# Patient Record
Sex: Female | Born: 1955
Health system: Southern US, Community
[De-identification: ages and names within clinical notes are randomized; demographics above are authoritative.]

## PROBLEM LIST (undated history)

## (undated) DIAGNOSIS — M949 Disorder of cartilage, unspecified: Secondary | ICD-10-CM

## (undated) DIAGNOSIS — E785 Hyperlipidemia, unspecified: Secondary | ICD-10-CM

## (undated) DIAGNOSIS — Z87898 Personal history of other specified conditions: Secondary | ICD-10-CM

## (undated) DIAGNOSIS — F329 Major depressive disorder, single episode, unspecified: Secondary | ICD-10-CM

## (undated) DIAGNOSIS — G4700 Insomnia, unspecified: Secondary | ICD-10-CM

## (undated) DIAGNOSIS — M899 Disorder of bone, unspecified: Secondary | ICD-10-CM

## (undated) DIAGNOSIS — F411 Generalized anxiety disorder: Secondary | ICD-10-CM

## (undated) DIAGNOSIS — J309 Allergic rhinitis, unspecified: Secondary | ICD-10-CM

## (undated) DIAGNOSIS — E039 Hypothyroidism, unspecified: Secondary | ICD-10-CM

## (undated) HISTORY — DX: Insomnia, unspecified: G47.00

## (undated) HISTORY — DX: Disorder of cartilage, unspecified: M94.9

## (undated) HISTORY — DX: Disorder of bone, unspecified: M89.9

## (undated) HISTORY — DX: Generalized anxiety disorder: F41.1

## (undated) HISTORY — DX: Hyperlipidemia, unspecified: E78.5

## (undated) HISTORY — DX: Major depressive disorder, single episode, unspecified: F32.9

## (undated) HISTORY — DX: Hypothyroidism, unspecified: E03.9

## (undated) HISTORY — DX: Allergic rhinitis, unspecified: J30.9

## (undated) HISTORY — DX: Personal history of other specified conditions: Z87.898

---

## 1998-07-07 ENCOUNTER — Other Ambulatory Visit: Admission: RE | Admit: 1998-07-07 | Discharge: 1998-07-07 | Payer: Self-pay | Admitting: Obstetrics and Gynecology

## 1999-10-20 ENCOUNTER — Other Ambulatory Visit: Admission: RE | Admit: 1999-10-20 | Discharge: 1999-10-20 | Payer: Self-pay | Admitting: Obstetrics and Gynecology

## 1999-12-05 ENCOUNTER — Encounter: Payer: Self-pay | Admitting: Internal Medicine

## 1999-12-05 LAB — CONVERTED CEMR LAB

## 2001-12-18 ENCOUNTER — Other Ambulatory Visit: Admission: RE | Admit: 2001-12-18 | Discharge: 2001-12-18 | Payer: Self-pay | Admitting: Obstetrics and Gynecology

## 2006-03-28 ENCOUNTER — Ambulatory Visit: Payer: Self-pay | Admitting: Internal Medicine

## 2007-04-15 ENCOUNTER — Ambulatory Visit: Payer: Self-pay | Admitting: Internal Medicine

## 2007-04-15 LAB — CONVERTED CEMR LAB
BUN: 7 mg/dL (ref 6–23)
Basophils Absolute: 0 10*3/uL (ref 0.0–0.1)
Bilirubin, Direct: 0.1 mg/dL (ref 0.0–0.3)
CO2: 30 meq/L (ref 19–32)
Calcium: 9.2 mg/dL (ref 8.4–10.5)
Cholesterol: 297 mg/dL (ref 0–200)
Direct LDL: 181.9 mg/dL
Eosinophils Absolute: 0.4 10*3/uL (ref 0.0–0.6)
GFR calc Af Amer: 85 mL/min
GFR calc non Af Amer: 70 mL/min
Glucose, Bld: 107 mg/dL — ABNORMAL HIGH (ref 70–99)
HDL: 47.4 mg/dL (ref >39.0)
Ketones, ur: NEGATIVE mg/dL
Lymphocytes Relative: 43.9 % (ref 12.0–46.0)
MCHC: 35.2 g/dL (ref 30.0–36.0)
MCV: 84.6 fL (ref 78.0–100.0)
Monocytes Relative: 8.9 % (ref 3.0–11.0)
Mucus, UA: NEGATIVE
Neutro Abs: 3.1 10*3/uL (ref 1.4–7.7)
Platelets: 337 10*3/uL (ref 150–400)
Potassium: 4.5 meq/L (ref 3.5–5.1)
RBC: 4.58 M/uL (ref 3.87–5.11)
TSH: 1.27 microintl units/mL (ref 0.35–5.50)
Total Protein: 6.7 g/dL (ref 6.0–8.3)
Triglycerides: 294 mg/dL (ref 0–149)
Urine Glucose: NEGATIVE mg/dL
Urobilinogen, UA: 0.2 (ref 0.0–1.0)
pH: 6 (ref 5.0–8.0)

## 2007-04-24 ENCOUNTER — Ambulatory Visit: Payer: Self-pay | Admitting: Internal Medicine

## 2007-10-09 ENCOUNTER — Encounter: Payer: Self-pay | Admitting: Internal Medicine

## 2007-10-09 DIAGNOSIS — J309 Allergic rhinitis, unspecified: Secondary | ICD-10-CM

## 2007-10-09 DIAGNOSIS — Z87898 Personal history of other specified conditions: Secondary | ICD-10-CM | POA: Insufficient documentation

## 2007-10-09 DIAGNOSIS — E7800 Pure hypercholesterolemia, unspecified: Secondary | ICD-10-CM | POA: Insufficient documentation

## 2007-10-09 HISTORY — DX: Personal history of other specified conditions: Z87.898

## 2007-10-09 HISTORY — DX: Allergic rhinitis, unspecified: J30.9

## 2007-10-10 ENCOUNTER — Ambulatory Visit: Payer: Self-pay | Admitting: Internal Medicine

## 2007-10-10 DIAGNOSIS — E039 Hypothyroidism, unspecified: Secondary | ICD-10-CM | POA: Insufficient documentation

## 2007-10-10 DIAGNOSIS — E785 Hyperlipidemia, unspecified: Secondary | ICD-10-CM | POA: Insufficient documentation

## 2007-10-10 DIAGNOSIS — F411 Generalized anxiety disorder: Secondary | ICD-10-CM

## 2007-10-10 HISTORY — DX: Generalized anxiety disorder: F41.1

## 2007-10-10 HISTORY — DX: Hyperlipidemia, unspecified: E78.5

## 2007-10-10 HISTORY — DX: Hypothyroidism, unspecified: E03.9

## 2008-04-20 ENCOUNTER — Ambulatory Visit: Payer: Self-pay | Admitting: Internal Medicine

## 2008-04-20 LAB — CONVERTED CEMR LAB
ALT: 21 units/L (ref 0–35)
AST: 24 units/L (ref 0–37)
Albumin: 3.9 g/dL (ref 3.5–5.2)
Alkaline Phosphatase: 71 units/L (ref 39–117)
BUN: 9 mg/dL (ref 6–23)
Basophils Absolute: 0 10*3/uL (ref 0.0–0.1)
Basophils Relative: 0.6 % (ref 0.0–1.0)
Bilirubin Urine: NEGATIVE
Bilirubin, Direct: 0.1 mg/dL (ref 0.0–0.3)
CO2: 30 meq/L (ref 19–32)
Calcium: 9.3 mg/dL (ref 8.4–10.5)
Chloride: 108 meq/L (ref 96–112)
Cholesterol: 267 mg/dL (ref 0–200)
Creatinine, Ser: 0.9 mg/dL (ref 0.4–1.2)
Direct LDL: 185.4 mg/dL
Eosinophils Absolute: 0.4 10*3/uL (ref 0.0–0.7)
Eosinophils Relative: 5.3 % — ABNORMAL HIGH (ref 0.0–5.0)
GFR calc Af Amer: 85 mL/min
GFR calc non Af Amer: 70 mL/min
Glucose, Bld: 87 mg/dL (ref 70–99)
HCT: 40.6 % (ref 36.0–46.0)
HDL: 56.6 mg/dL (ref >39.0)
Hemoglobin: 13.9 g/dL (ref 12.0–15.0)
Ketones, ur: NEGATIVE mg/dL
Leukocytes, UA: NEGATIVE
Lymphocytes Relative: 48 % — ABNORMAL HIGH (ref 12.0–46.0)
MCHC: 34.1 g/dL (ref 30.0–36.0)
MCV: 86 fL (ref 78.0–100.0)
Monocytes Absolute: 0.6 10*3/uL (ref 0.1–1.0)
Monocytes Relative: 8 % (ref 3.0–12.0)
Neutro Abs: 2.9 10*3/uL (ref 1.4–7.7)
Neutrophils Relative %: 38.1 % — ABNORMAL LOW (ref 43.0–77.0)
Nitrite: NEGATIVE
Platelets: 319 10*3/uL (ref 150–400)
Potassium: 4.8 meq/L (ref 3.5–5.1)
RBC: 4.72 M/uL (ref 3.87–5.11)
RDW: 12.3 % (ref 11.5–14.6)
Sodium: 142 meq/L (ref 135–145)
Specific Gravity, Urine: 1.01 (ref 1.000–1.03)
TSH: 8.66 microintl units/mL — ABNORMAL HIGH (ref 0.35–5.50)
Total Bilirubin: 1.1 mg/dL (ref 0.3–1.2)
Total CHOL/HDL Ratio: 4.7
Total Protein, Urine: NEGATIVE mg/dL
Total Protein: 7.1 g/dL (ref 6.0–8.3)
Triglycerides: 115 mg/dL (ref 0–149)
Urine Glucose: NEGATIVE mg/dL
Urobilinogen, UA: 0.2 (ref 0.0–1.0)
VLDL: 23 mg/dL (ref 0–40)
WBC: 7.5 10*3/uL (ref 4.5–10.5)
pH: 5.5 (ref 5.0–8.0)

## 2008-04-28 ENCOUNTER — Telehealth: Payer: Self-pay | Admitting: Internal Medicine

## 2008-04-28 ENCOUNTER — Ambulatory Visit: Payer: Self-pay | Admitting: Internal Medicine

## 2008-04-28 DIAGNOSIS — J019 Acute sinusitis, unspecified: Secondary | ICD-10-CM | POA: Insufficient documentation

## 2009-04-30 ENCOUNTER — Ambulatory Visit: Payer: Self-pay | Admitting: Internal Medicine

## 2009-04-30 DIAGNOSIS — N390 Urinary tract infection, site not specified: Secondary | ICD-10-CM | POA: Insufficient documentation

## 2009-04-30 LAB — CONVERTED CEMR LAB
ALT: 17 units/L (ref 0–35)
AST: 21 units/L (ref 0–37)
Albumin: 3.8 g/dL (ref 3.5–5.2)
BUN: 8 mg/dL (ref 6–23)
Basophils Relative: 0.5 % (ref 0.0–3.0)
Bilirubin Urine: NEGATIVE
Chloride: 109 meq/L (ref 96–112)
Cholesterol: 184 mg/dL (ref 0–200)
Glucose, Bld: 77 mg/dL (ref 70–99)
Hemoglobin, Urine: NEGATIVE
Hemoglobin: 14.1 g/dL (ref 12.0–15.0)
LDL Cholesterol: 93 mg/dL (ref 0–99)
Lymphocytes Relative: 39.3 % (ref 12.0–46.0)
Monocytes Relative: 10.1 % (ref 3.0–12.0)
Neutro Abs: 4.7 10*3/uL (ref 1.4–7.7)
Nitrite: POSITIVE
Potassium: 3.8 meq/L (ref 3.5–5.1)
RBC: 4.48 M/uL (ref 3.87–5.11)
Total Bilirubin: 1.8 mg/dL — ABNORMAL HIGH (ref 0.3–1.2)
Total CHOL/HDL Ratio: 3
Total Protein, Urine: 30 mg/dL

## 2009-05-11 ENCOUNTER — Ambulatory Visit: Payer: Self-pay | Admitting: Internal Medicine

## 2009-05-11 DIAGNOSIS — N959 Unspecified menopausal and perimenopausal disorder: Secondary | ICD-10-CM | POA: Insufficient documentation

## 2009-06-14 ENCOUNTER — Telehealth: Payer: Self-pay | Admitting: Internal Medicine

## 2009-06-14 ENCOUNTER — Ambulatory Visit: Payer: Self-pay | Admitting: Internal Medicine

## 2009-06-15 LAB — CONVERTED CEMR LAB: Chlamydia, Swab/Urine, PCR: NEGATIVE

## 2009-08-24 ENCOUNTER — Telehealth: Payer: Self-pay | Admitting: Internal Medicine

## 2009-09-16 ENCOUNTER — Telehealth: Payer: Self-pay | Admitting: Internal Medicine

## 2009-09-20 ENCOUNTER — Inpatient Hospital Stay (HOSPITAL_COMMUNITY): Admission: AD | Admit: 2009-09-20 | Discharge: 2009-09-23 | Payer: Self-pay | Admitting: Psychiatry

## 2009-09-20 ENCOUNTER — Ambulatory Visit: Payer: Self-pay | Admitting: Internal Medicine

## 2009-09-20 ENCOUNTER — Ambulatory Visit: Payer: Self-pay | Admitting: Psychiatry

## 2009-09-20 DIAGNOSIS — F329 Major depressive disorder, single episode, unspecified: Secondary | ICD-10-CM

## 2009-09-20 HISTORY — DX: Major depressive disorder, single episode, unspecified: F32.9

## 2009-09-27 ENCOUNTER — Ambulatory Visit: Payer: Self-pay | Admitting: Internal Medicine

## 2009-09-27 DIAGNOSIS — G4700 Insomnia, unspecified: Secondary | ICD-10-CM

## 2009-09-27 DIAGNOSIS — K0889 Other specified disorders of teeth and supporting structures: Secondary | ICD-10-CM | POA: Insufficient documentation

## 2009-09-27 HISTORY — DX: Insomnia, unspecified: G47.00

## 2009-12-31 IMAGING — CT CT HEAD W/O CM - CT MAXILLOFACIAL W/O CM
3 series · 16 of 47 positions shown, 19 images · non-contrast
Comparison: NONE

CLINICAL DATA: Recurrent sinus infection treated with three 
antibiotics.  

PARANASAL SINUS CT
TECHNIQUE: Multiple contiguous axial images are performed 
through the paranasal sinuses with reformatted sagittal and 
coronal imaging presented for review.

[Series 3: 3x3 · axial · 0.26mm/px · z∈[+86,+176]mm · 10 of 36 slices shown, 13 images]
[im 3/36  brain]
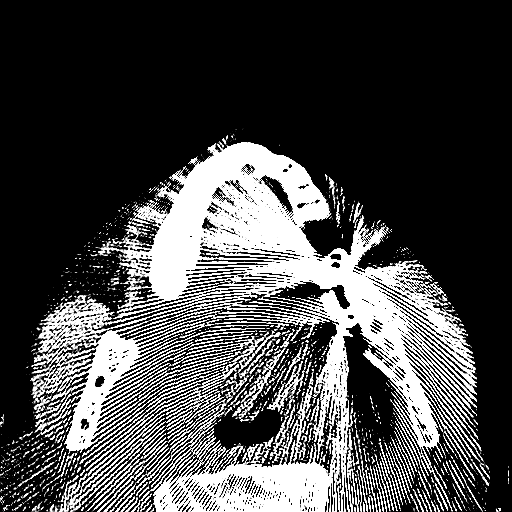
[im 3/36  bone]
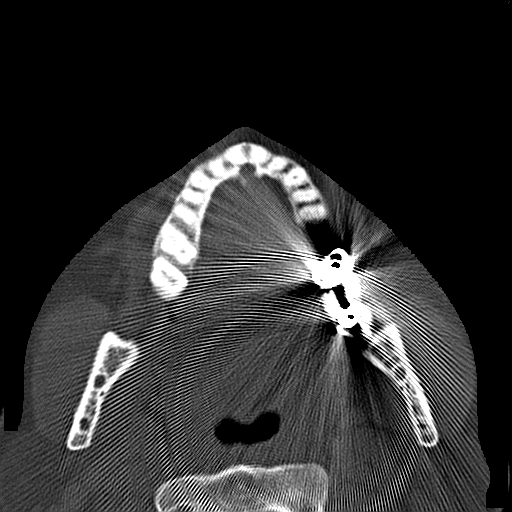
[im 7/36  bone]
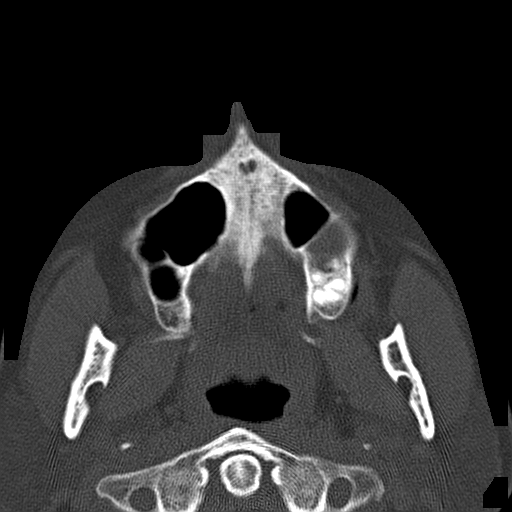
[im 9/36  bone]
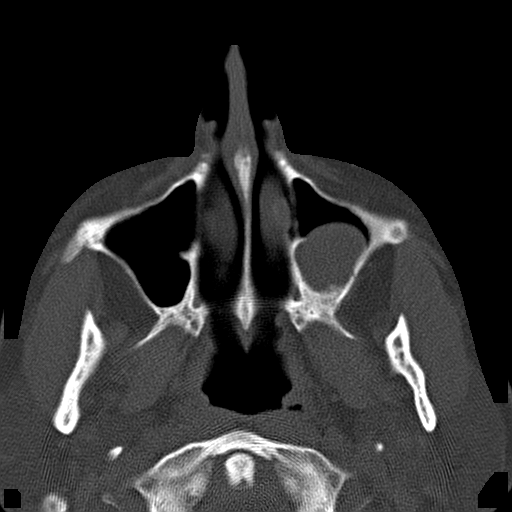
[im 13/36  bone]
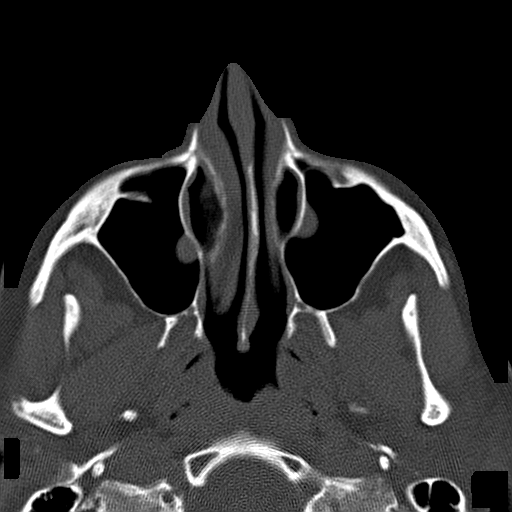
[im 16/36  brain]
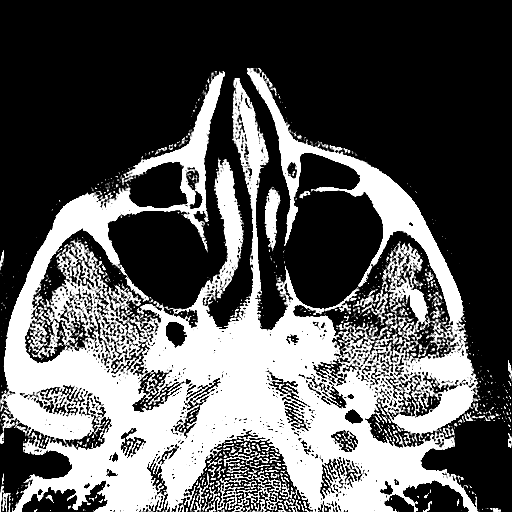
[im 16/36  bone]
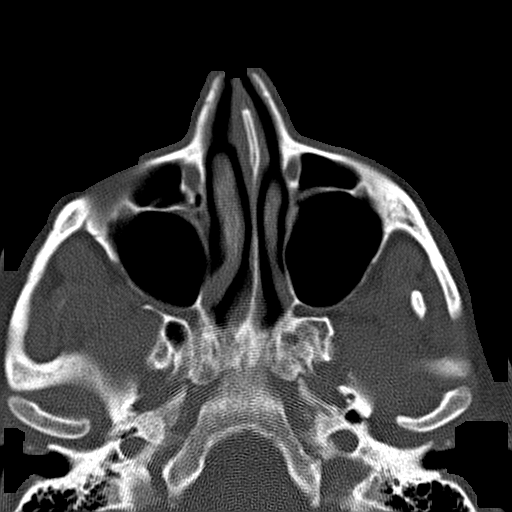
[im 19/36  bone]
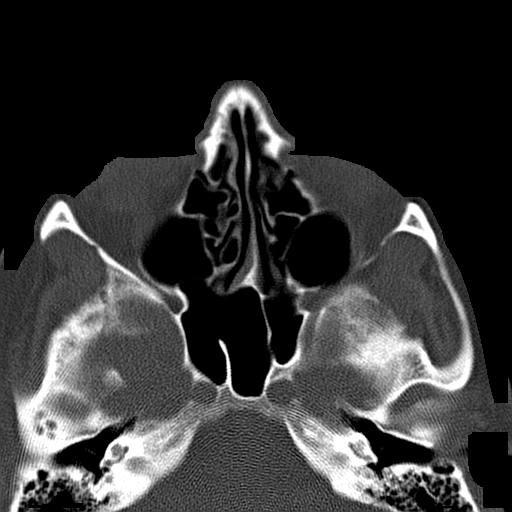
[im 22/36  bone]
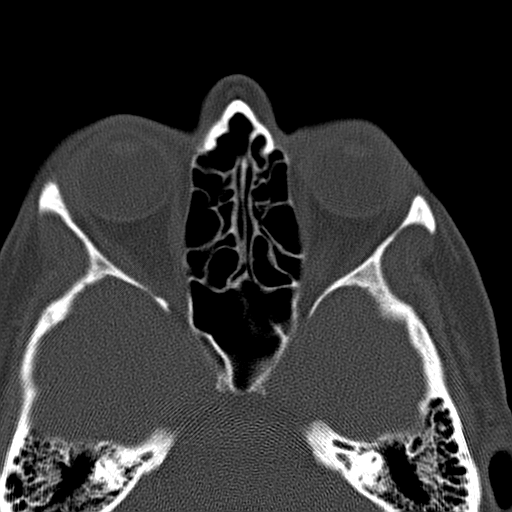
[im 26/36  bone]
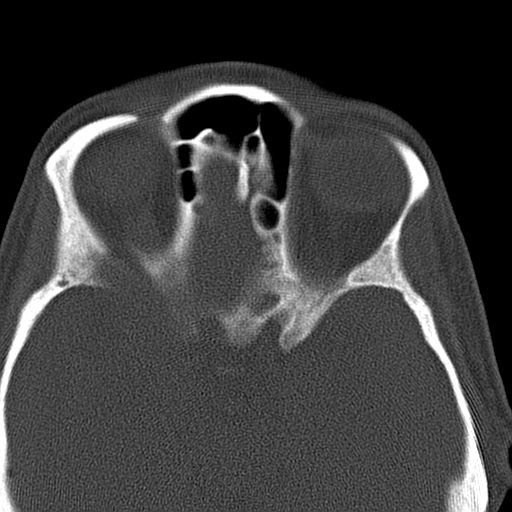
[im 28/36  brain]
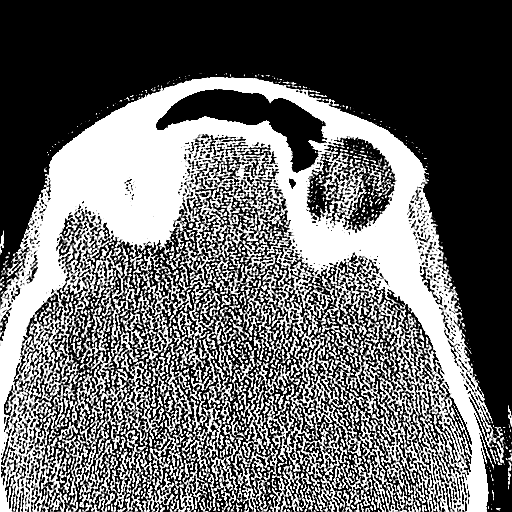
[im 28/36  bone]
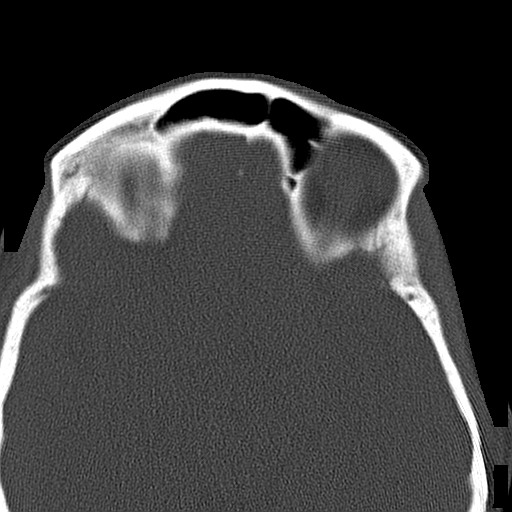
[im 33/36  bone]
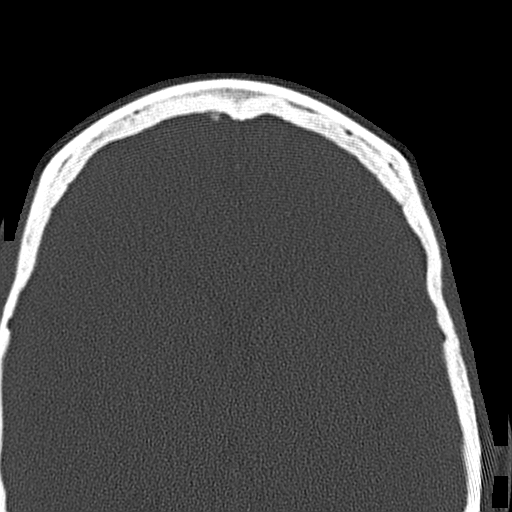

[coronals · coronal · 0.26mm/px · 3 of 33 slices shown]
[im 11/33  bone]
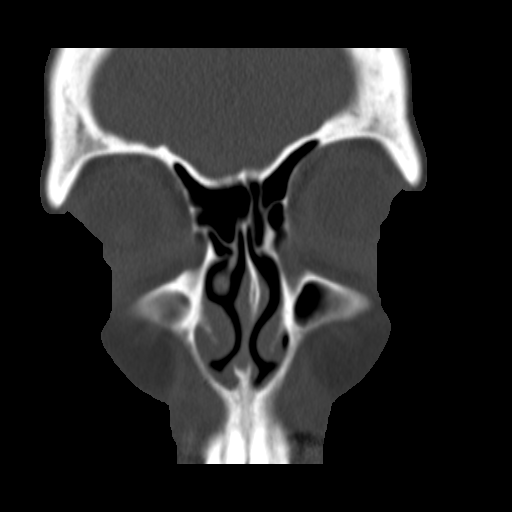
[im 15/33  bone]
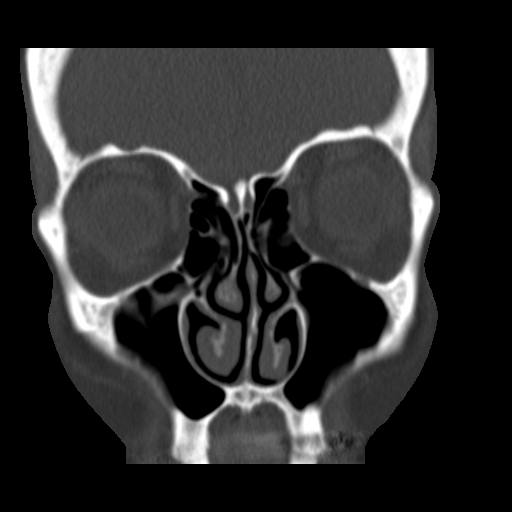
[im 18/33  bone]
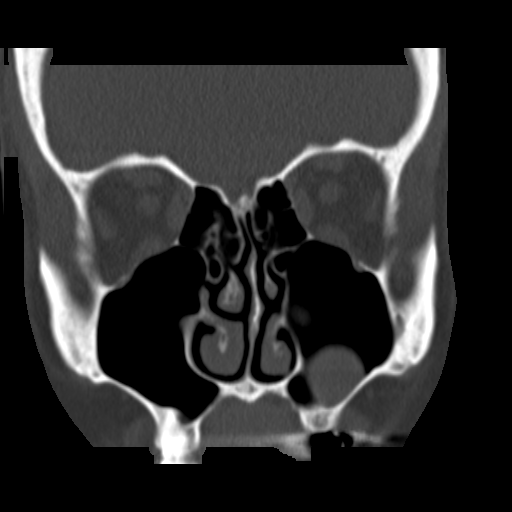

[sagittals · sagittal · 0.26mm/px · 3 of 37 slices shown]
[im 13/37  bone]
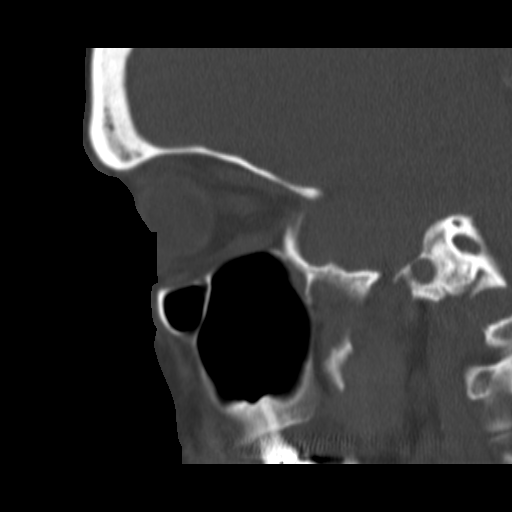
[im 19/37  bone]
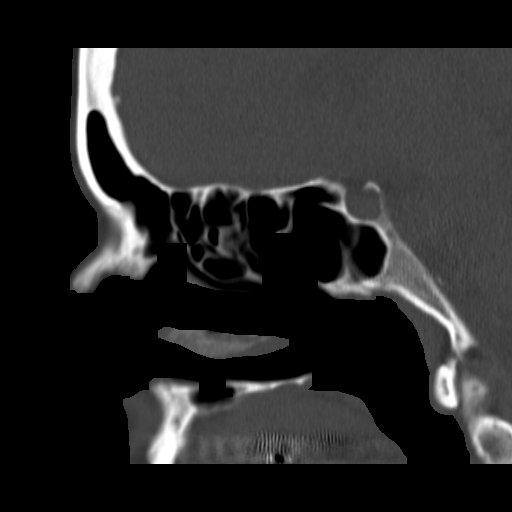
[im 25/37  bone]
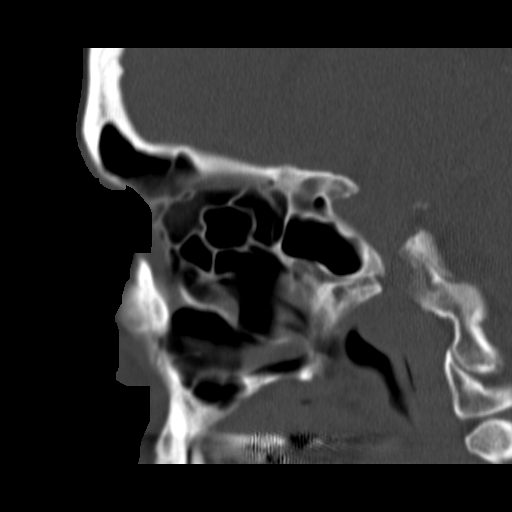

[16 of 47 positions shown; findings below may reference images not displayed]

FINDINGS: Mild nasoseptal deviation to the left is noted. There 
is an approximately 2-cm mucus retention cyst in the inferior 
aspect of the left maxillary sinus with an additional 1-cm mucus 
retention cyst versus focus of polypoid mucosal thickening along 
the medial wall of the left maxillary sinus with a similar finding 
noted on the right which measures 8 mm. No significant paranasal 
sinus opacification or air-fluid levels are identified. Orbits and 
mastoids are unremarkable.
IMPRESSION: Bilateral maxillary sinus mucus retention cysts with 
no definite evidence of sinusitis. Kiuver Moreiira, M.D.

## 2010-10-13 ENCOUNTER — Ambulatory Visit: Payer: Self-pay | Admitting: Internal Medicine

## 2010-11-07 ENCOUNTER — Ambulatory Visit: Payer: Self-pay | Admitting: Family Medicine

## 2010-11-07 DIAGNOSIS — J209 Acute bronchitis, unspecified: Secondary | ICD-10-CM | POA: Insufficient documentation

## 2010-11-07 DIAGNOSIS — J069 Acute upper respiratory infection, unspecified: Secondary | ICD-10-CM | POA: Insufficient documentation

## 2010-11-09 ENCOUNTER — Telehealth (INDEPENDENT_AMBULATORY_CARE_PROVIDER_SITE_OTHER): Payer: Self-pay | Admitting: *Deleted

## 2010-11-21 ENCOUNTER — Telehealth: Payer: Self-pay | Admitting: Internal Medicine

## 2010-11-22 ENCOUNTER — Ambulatory Visit: Payer: Self-pay | Admitting: Internal Medicine

## 2010-11-23 ENCOUNTER — Encounter: Payer: Self-pay | Admitting: Internal Medicine

## 2010-11-23 LAB — CONVERTED CEMR LAB
Alkaline Phosphatase: 75 units/L (ref 39–117)
BUN: 10 mg/dL (ref 6–23)
Basophils Relative: 1.1 % (ref 0.0–3.0)
Bilirubin Urine: NEGATIVE
Bilirubin, Direct: 0.2 mg/dL (ref 0.0–0.3)
Calcium: 9.5 mg/dL (ref 8.4–10.5)
Cholesterol: 242 mg/dL — ABNORMAL HIGH (ref 0–200)
Creatinine, Ser: 0.9 mg/dL (ref 0.4–1.2)
Eosinophils Absolute: 0.5 10*3/uL (ref 0.0–0.7)
Eosinophils Relative: 6.5 % — ABNORMAL HIGH (ref 0.0–5.0)
Ketones, ur: NEGATIVE mg/dL
Lymphocytes Relative: 43.2 % (ref 12.0–46.0)
MCHC: 35.3 g/dL (ref 30.0–36.0)
Neutrophils Relative %: 42.7 % — ABNORMAL LOW (ref 43.0–77.0)
Nitrite: NEGATIVE
Platelets: 317 10*3/uL (ref 150.0–400.0)
RBC: 4.68 M/uL (ref 3.87–5.11)
Total Bilirubin: 1.5 mg/dL — ABNORMAL HIGH (ref 0.3–1.2)
Total CHOL/HDL Ratio: 4
Total Protein: 6.7 g/dL (ref 6.0–8.3)
Triglycerides: 216 mg/dL — ABNORMAL HIGH (ref 0.0–149.0)
Urobilinogen, UA: 0.2 (ref 0.0–1.0)
VLDL: 43.2 mg/dL — ABNORMAL HIGH (ref 0.0–40.0)
WBC: 7.9 10*3/uL (ref 4.5–10.5)
pH: 7 (ref 5.0–8.0)

## 2010-11-24 LAB — CONVERTED CEMR LAB
HCV Ab: NEGATIVE
HIV: NONREACTIVE
Hep A IgM: NEGATIVE
Hep B C IgM: NEGATIVE

## 2010-11-25 ENCOUNTER — Encounter: Payer: Self-pay | Admitting: Internal Medicine

## 2010-11-25 ENCOUNTER — Ambulatory Visit: Payer: Self-pay | Admitting: Internal Medicine

## 2010-11-25 DIAGNOSIS — M949 Disorder of cartilage, unspecified: Secondary | ICD-10-CM

## 2010-11-25 DIAGNOSIS — M899 Disorder of bone, unspecified: Secondary | ICD-10-CM | POA: Insufficient documentation

## 2010-11-25 HISTORY — DX: Disorder of bone, unspecified: M89.9

## 2011-01-03 NOTE — Assessment & Plan Note (Signed)
Summary: URI   Vital Signs:  Patient Profile:   55 Years Old Female CC:      Sore throat, congestion, yellow sputum, cough, fatigue x 1 week Height:     68.5 inches (170.18 cm) Weight:      140 pounds O2 Sat:      100 % O2 treatment:    Room Air Temp:     98.6 degrees F oral Pulse rate:   69 / minute Pulse rhythm:   regular Resp:     16 per minute BP sitting:   131 / 84  (left arm) Cuff size:   regular  Vitals Entered By: Emilio Math (November 07, 2010 12:59 PM)                  Current Allergies (reviewed today): ! Joyce Copa ! PRAVACHOLHistory of Present Illness Chief Complaint: Sore throat, congestion, yellow sputum, cough, fatigue x 1 week History of Present Illness:  Subjective: Patient complains of URI symptoms that started one week ago with sore throat (now resolved).  Patient is a smoker  + cough for 3 days, worse at night. No pleuritic pain No wheezing + nasal congestion + post-nasal drainage + sinus pain/pressure No itchy/red eyes No earache No hemoptysis No SOB No fever/chills No nausea No vomiting No abdominal pain No diarrhea No skin rashes + fatigue No myalgias + headache Used OTC meds without relief   REVIEW OF SYSTEMS Constitutional Symptoms      Denies fever, chills, night sweats, weight loss, weight gain, and fatigue.  Eyes       Denies change in vision, eye pain, eye discharge, glasses, contact lenses, and eye surgery. Ear/Nose/Throat/Mouth       Complains of frequent runny nose, sinus problems, sore throat, and hoarseness.      Denies hearing loss/aids, change in hearing, ear pain, ear discharge, dizziness, frequent nose bleeds, and tooth pain or bleeding.  Respiratory       Complains of productive cough and shortness of breath.      Denies dry cough, wheezing, asthma, bronchitis, and emphysema/COPD.  Cardiovascular       Complains of tires easily with exhertion.      Denies murmurs and chest pain.    Gastrointestinal  Denies stomach pain, nausea/vomiting, diarrhea, constipation, blood in bowel movements, and indigestion. Genitourniary       Denies painful urination, kidney stones, and loss of urinary control. Neurological       Denies paralysis, seizures, and fainting/blackouts. Musculoskeletal       Denies muscle pain, joint pain, joint stiffness, decreased range of motion, redness, swelling, muscle weakness, and gout.  Skin       Denies bruising, unusual mles/lumps or sores, and hair/skin or nail changes.  Psych       Denies mood changes, temper/anger issues, anxiety/stress, speech problems, depression, and sleep problems.  Past History:  Past Medical History: Reviewed history from 04/28/2008 and no changes required. Allergic rhinitis Hyperlipidemia Hypothyroidism fibrocystic breast disease  Past Surgical History: Reviewed history from 10/09/2007 and no changes required. Denies surgical history  Family History: Reviewed history from 04/28/2008 and no changes required. 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  Extraocular movement is intact.  Conjunctivae are not inflamed.  Ears:  Canals normal.  Tympanic membranes normal.   Nose:  Normal septum.  Normal turbinates, mildly congested.   No sinus tenderness present.  Pharynx:  Normal  Neck:  Supple.  Slightly tender shotty posterior nodes are palpated bilaterally.  Lungs:  Clear to auscultation.  Breath sounds are equal.  Heart:  Regular rate and rhythm without murmurs, rubs, or gallops.  Abdomen:  Nontender without masses or hepatosplenomegaly.  Bowel sounds are present.  No CVA or flank tenderness.    Extremities:  No edema.   Skin:  No rash Assessment New Problems: ACUTE BRONCHITIS (ICD-466.0) UPPER RESPIRATORY INFECTION, ACUTE (ICD-465.9)   Plan New Medications/Changes: BENZONATATE 200 MG CAPS (BENZONATATE) One by mouth hs as needed cough  #12 x 0, 11/07/2010, Donna Christen MD AZITHROMYCIN 250 MG TABS (AZITHROMYCIN) Two tabs by mouth on day 1, then 1 tab daily on days 2 through 5  #6 tabs x 0, 11/07/2010, Donna Christen MD  New Orders: New Patient Level III [phone] Planning Comments:   Begin Z-pack, expectorant/decongestant, cough suppressant at bedtime.  Increase fluid intake Followup with PCP if not improving 7 to 10 days   The patient and/or caregiver has been counseled thoroughly with regard to medications prescribed including dosage, schedule, interactions, rationale for use, and possible side effects and they verbalize understanding.  Diagnoses and expected course of recovery discussed and will return if not improved as expected or if the condition worsens. Patient and/or caregiver verbalized understanding.  Prescriptions: BENZONATATE 200 MG CAPS (BENZONATATE) One by mouth hs as needed cough  #12 x 0   Entered and Authorized by:   Donna Christen MD   Signed by:   Donna Christen MD on 11/07/2010   Method used:   Print then Give to Patient   RxID:   [id number] AZITHROMYCIN 250 MG TABS (AZITHROMYCIN) Two tabs by mouth on day 1, then 1 tab daily on days 2 through 5  #6 tabs x 0   Entered and Authorized by:   Donna Christen MD   Signed by:   Donna Christen MD on 11/07/2010   Method used:   Print then Give to Patient   RxID:   [id number]   Patient Instructions: 1)  May use Mucinex D (guaifenesin with decongestant) each morning for congestion.  2)  Increase fluid intake, rest. 3)  May use Afrin nasal spray (or generic oxymetazoline) twice daily for about 5 days.  Also recommend using saline nasal spray several times daily and/or saline nasal irrigation. 4)  Followup  with family doctor if not improving 7 to 10 days.  Orders Added: 1)  New Patient Level III [36644]

## 2011-01-03 NOTE — Progress Notes (Signed)
  Phone Note Outgoing Call   Call placed by: Lajean Saver RN,  November 09, 2010 3:04 PM Call placed to: Patient Summary of Call: Call returend from patients message. Patient informed Amox 875mg  two times a day x 10 days called in to [facility] in [place].

## 2011-01-05 NOTE — Assessment & Plan Note (Signed)
Summary: Cpx/#/cd   Vital Signs:  Patient profile:   55 year old female Height:      67 inches Weight:      145.13 pounds BMI:     22.81 O2 Sat:      97 % on Room air Temp:     98.2 degrees F oral Pulse rate:   80 / minute BP sitting:   116 / 72  (left arm) Cuff size:   regular  Vitals Entered By: Zella Ball Ewing CMA Duncan Dull) (November 25, 2010 1:14 PM)  O2 Flow:  Room air   Preventive Care Screening  Bone Density:    Date:  06/14/2009    Next Due:  06/2011    Results:  abnormal std dev  CC: Adult Physical/RE   CC:  Adult Physical/RE.  History of Present Illness: here for wellness, overall doing ok, current  boyfirend with Hep C and she has asked for STD labs and Hep C check with labs prior to today;  Pt denies CP, worsening sob, doe, wheezing, orthopnea, pnd, worsening LE edema, palps, dizziness or syncope  Pt denies new neuro symptoms such as headache, facial or extremity weakness  Pt denies polydipsia, polyuria, Admits  to  not followking  low chol diet as well as last yr, wt has been increasing due to dietary excess and little excercise however, and currently not taking med for thyroid that she had taken prevoiusly after she lost significant wt.  No fever, wt loss, night sweats, loss of appetite or other constitutional symptoms  Not currently taking any meds, though she has been advised to consider SSRI at her ongoing counsling.  Denies worsening depressive symptoms, suicidal ideation, or panic., but has relatively low grade ongoing depression symtpoms persists, for which she has FMLA for work.  No missed work recently, and no increased substance abuse or trouble with the law, though she states her new boyfriend also drinks to excess on weekends. Pt states good ability with ADL's, low fall risk, home safety reviewed and adequate, no significant change in hearing or vision,  and occasionally active only with regular excercise.   Preventive Screening-Counseling & Management      Drug  Use:  no.    Problems Prior to Update: 1)  Osteopenia  (ICD-733.90) 2)  Acute Bronchitis  (ICD-466.0) 3)  Upper Respiratory Infection, Acute  (ICD-465.9) 4)  Other Spec Disorders Teeth&supporting Structures  (ICD-525.8) 5)  Insomnia-sleep Disorder-unspec  (ICD-780.52) 6)  Depression, Major  (ICD-296.20) 7)  Menopausal Disorder  (ICD-627.9) 8)  Preventive Health Care  (ICD-V70.0) 9)  Uti  (ICD-599.0) 10)  Preventive Health Care  (ICD-V70.0) 11)  Sinusitis- Acute-nos  (ICD-461.9) 12)  Preventive Health Care  (ICD-V70.0) 13)  Anxiety State, Unspecified  (ICD-300.00) 14)  Family History of Colon Ca 1st Degree Relative <60  (ICD-V16.0) 15)  Family History of Cad Female 1st Degree Relative <50  (ICD-V17.3) 16)  Hypothyroidism  (ICD-244.9) 17)  Hyperlipidemia  (ICD-272.4) 18)  Obesity, Hx of  (ICD-V13.8) 19)  Hypercholesterolemia  (ICD-272.0) 20)  Allergic Rhinitis  (ICD-477.9)  Medications Prior to Update: 1)  Azithromycin 250 Mg Tabs (Azithromycin) .... Two Tabs By Mouth On Day 1, Then 1 Tab Daily On Days 2 Through 5 2)  Benzonatate 200 Mg Caps (Benzonatate) .... One By Mouth Hs As Needed Cough  Current Medications (verified): 1)  Levothyroxine Sodium 25 Mcg Tabs (Levothyroxine Sodium) .Marland Kitchen.. 1po Once Daily 2)  Phentermine Hcl 37.5 Mg Caps (Phentermine Hcl) .Marland Kitchen.. 1po Once Daily 3)  Lexapro 10 Mg Tabs (Escitalopram Oxalate) .Marland Kitchen.. 1 By Mouth Once Daily  Allergies (verified): 1)  ! * Allegra 2)  ! 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 several sisters with elevated cholesterol father with HTN, heart disease, pneumonia mother with heart disease, MI, HTN, colon cancer  Social History: Reviewed history from 11/07/2010 and no changes required. 1 ppd smoker, 2 yrs Alcohol use-no Married 2 children work - [employer] Drug use-no Drug Use:  no  Review of Systems  The patient denies anorexia, fever, vision loss, decreased hearing, hoarseness, chest pain, syncope, dyspnea on exertion, peripheral edema, prolonged cough, headaches, hemoptysis, abdominal pain, melena, hematochezia, severe indigestion/heartburn, hematuria, muscle weakness, suspicious skin lesions, transient blindness, difficulty walking, depression, unusual weight change, abnormal bleeding, enlarged lymph nodes, and angioedema.         all otherwise negative per pt -    Physical Exam  General:  alert and well-developed.   Head:  normocephalic and atraumatic.   Eyes:  vision grossly intact, pupils equal, and pupils round.   Ears:  R ear normal and L ear normal.   Nose:  no external deformity and no nasal discharge.   Mouth:  pharyngeal erythema.  and gum red, tender swollen to left upper rear molarno gingival abnormalities and pharynx pink and moist.   Neck:  supple and no masses.   Lungs:  normal respiratory effort and normal breath sounds.   Heart:  normal rate and regular rhythm.   Abdomen:  soft, non-tender, and normal bowel sounds.   Msk:  no joint tenderness and no joint swelling.   Extremities:  no edema, no erythema  Neurologic:  alert & oriented X3.   Skin:  color normal and no rashes.   Psych:  dysphoric affect and moderately anxious.     Impression & Recommendations:  Problem # 1:  Preventive Health Care (ICD-V70.0) Overall doing well, age appropriate education and counseling updated, referral  for preventive services and immunizations addressed, dietary counseling and smoking status adressed , most recent labs reviewed, ecg reviewed I have personally reviewed and have noted 1.The patient's medical and social history 2.Their use of alcohol, tobacco or illicit drugs 3.Their current medications and supplements 4. Functional ability including ADL's, fall risk, home safety risk, hearing & visual impairment  5.Diet and physical activities 6.Evidence for depression or mood disorders The patients weight, height, BMI  have been recorded in the chart I have made referrals, counseling and provided education to the patient based review of the above  Orders: EKG w/ Interpretation (93000) Gastroenterology Referral (GI)  Problem # 2:  HYPOTHYROIDISM (ICD-244.9)  sublicnical, but will tx with synthroid 25 once daily   Her updated medication list for this problem includes:    Levothyroxine Sodium 25 Mcg Tabs (Levothyroxine sodium) .Marland Kitchen... 1po once daily  Labs Reviewed: TSH: 7.65 (11/23/2010)    Chol: 242 (11/23/2010)   HDL: 62.70 (11/23/2010)   LDL: 93 (04/30/2009)   TG: 216.0 (11/23/2010)  Problem # 3:  OBESITY, HX OF (ICD-V13.8)  for trial phentermine asd, limited rx   Problem # 4:  DEPRESSION, MAJOR (ICD-296.20) now divorce finalized, improved overall,   still sees counseling monthly; declines med but I suggestd she could call for lexapro 10 mg , stil;l on FMLA for depression for work  Problem # 5:  HYPERLIPIDEMIA (ICD-272.4)  Labs Reviewed: SGOT: 19 (11/23/2010)   SGPT: 17 (11/23/2010)   HDL:62.70 (11/23/2010), 66.90 (04/30/2009)  LDL:93 (04/30/2009), DEL (03/47/4259)  Chol:242 (11/23/2010), 184 (04/30/2009)  Trig:216.0 (11/23/2010), 119.0 (04/30/2009) d/w pt , declines med at this point, to focus on diet as she did bery well with that last yr  Complete Medication List: 1)  Levothyroxine Sodium 25 Mcg Tabs (Levothyroxine sodium) .Marland Kitchen.. 1po once daily 2)  Phentermine Hcl 37.5 Mg Caps  (Phentermine hcl) .Marland Kitchen.. 1po once daily 3)  Lexapro 10 Mg Tabs (Escitalopram oxalate) .Marland Kitchen.. 1 by mouth once daily  Patient Instructions: 1)  Please take all new medications as prescribed - the thyroid med, phentermine, and lexapro 2)  Continue all previous medications as before this visit  3)  You will be contacted about the referral(s) to: colonscopy 4)  Please continue your counseling as you have planned 5)  Please follow lower cholesterol diet 6)  please call for yearly mammogram and pap smear as you do 7)  Please schedule a follow-up appointment in 1 year, or sooner if needed Prescriptions: LEXAPRO 10 MG TABS (ESCITALOPRAM OXALATE) 1 by mouth once daily  #90 x 3   Entered and Authorized by:   Corwin Levins MD   Signed by:   Corwin Levins MD on 11/25/2010   Method used:   Print then Give to Patient   RxID:   [id number] PHENTERMINE HCL 37.5 MG CAPS (PHENTERMINE HCL) 1po once daily  #30 x 2   Entered and Authorized by:   Corwin Levins MD   Signed by:   Corwin Levins MD on 11/25/2010   Method used:   Print then Give to Patient   RxID:   [id number] LEVOTHYROXINE SODIUM 25 MCG TABS (LEVOTHYROXINE SODIUM) 1po once daily  #90 x 3   Entered and Authorized by:   Corwin Levins MD   Signed by:   Corwin Levins MD on 11/25/2010   Method used:   Print then Give to Patient   RxID:   [id number]    Orders Added: 1)  EKG w/ Interpretation [93000] 2)  Gastroenterology Referral [GI] 3)  Est. Patient 40-64 years [37628]

## 2011-01-05 NOTE — Progress Notes (Signed)
Summary: Lab req  Phone Note Call from Patient Call back at [facility] Phone [phone]   Caller: Patient Summary of Call: Pt called stating that her boyfriend was Dx with Hep C. Pt is requesting lab to test for Hep C Initial call taken by: Margaret Pyle, CMA,  November 21, 2010 1:14 PM  Follow-up for Phone Call        ok for acute hepatitis panel  - v01.6 Follow-up by: Corwin Levins MD,  November 21, 2010 2:09 PM  Additional Follow-up for Phone Call Additional follow up Details #1::        Labs entered added-on STD panel per pt req Additional Follow-up by: Margaret Pyle, CMA,  November 21, 2010 3:35 PM

## 2011-03-09 LAB — URINE DRUGS OF ABUSE SCREEN W ALC, ROUTINE (REF LAB)
Creatinine,U: 228.6 mg/dL
Ethyl Alcohol: <10 mg/dL (ref <10)
Opiate Screen, Urine: NEGATIVE
Phencyclidine (PCP): NEGATIVE
Propoxyphene: NEGATIVE

## 2011-03-09 LAB — COMPREHENSIVE METABOLIC PANEL
Albumin: 3.7 g/dL (ref 3.5–5.2)
Alkaline Phosphatase: 54 U/L (ref 39–117)
BUN: 9 mg/dL (ref 6–23)
GFR calc Af Amer: >60 mL/min (ref >60)
Potassium: 3.7 mEq/L (ref 3.5–5.1)
Sodium: 137 mEq/L (ref 135–145)
Total Protein: 6.7 g/dL (ref 6.0–8.3)

## 2011-03-09 LAB — CBC
HCT: 42.4 % (ref 36.0–46.0)
Hemoglobin: 14.6 g/dL (ref 12.0–15.0)
MCHC: 34.5 g/dL (ref 30.0–36.0)
MCV: 92 fL (ref 78.0–100.0)
RBC: 4.61 MIL/uL (ref 3.87–5.11)
WBC: 10.8 10*3/uL — ABNORMAL HIGH (ref 4.0–10.5)

## 2011-03-09 LAB — URINALYSIS, ROUTINE W REFLEX MICROSCOPIC
Bilirubin Urine: NEGATIVE
Ketones, ur: NEGATIVE mg/dL
Nitrite: NEGATIVE
Urobilinogen, UA: 1 mg/dL (ref 0.0–1.0)

## 2011-03-09 LAB — BENZODIAZEPINE, QUANTITATIVE, URINE
Alprazolam (GC/LC/MS), ur confirm: NEGATIVE
Flurazepam GC/MS Conf: NEGATIVE
Oxazepam GC/MS Conf: 830 ng/mL

## 2011-03-09 LAB — THC (MARIJUANA), URINE, CONFIRMATION: Marijuana, Ur-Confirmation: 76 ng/mL

## 2011-10-24 ENCOUNTER — Telehealth: Payer: Self-pay

## 2011-10-24 DIAGNOSIS — Z0000 Encounter for general adult medical examination without abnormal findings: Secondary | ICD-10-CM

## 2011-10-24 NOTE — Telephone Encounter (Signed)
Put order in for physical labs. 

## 2011-12-12 ENCOUNTER — Encounter: Payer: Self-pay | Admitting: Internal Medicine

## 2011-12-16 ENCOUNTER — Encounter: Payer: Self-pay | Admitting: Internal Medicine

## 2011-12-16 DIAGNOSIS — Z0000 Encounter for general adult medical examination without abnormal findings: Secondary | ICD-10-CM | POA: Insufficient documentation

## 2011-12-18 ENCOUNTER — Other Ambulatory Visit: Payer: Self-pay | Admitting: Internal Medicine

## 2011-12-18 ENCOUNTER — Other Ambulatory Visit (INDEPENDENT_AMBULATORY_CARE_PROVIDER_SITE_OTHER): Payer: Self-pay

## 2011-12-18 ENCOUNTER — Telehealth: Payer: Self-pay

## 2011-12-18 DIAGNOSIS — Z0000 Encounter for general adult medical examination without abnormal findings: Secondary | ICD-10-CM

## 2011-12-18 DIAGNOSIS — A64 Unspecified sexually transmitted disease: Secondary | ICD-10-CM

## 2011-12-18 LAB — LIPID PANEL
Cholesterol: 240 mg/dL — ABNORMAL HIGH (ref 0–200)
Total CHOL/HDL Ratio: 4

## 2011-12-18 LAB — CBC WITH DIFFERENTIAL/PLATELET
Basophils Absolute: 0.1 10*3/uL (ref 0.0–0.1)
Eosinophils Absolute: 0.8 10*3/uL — ABNORMAL HIGH (ref 0.0–0.7)
HCT: 42.7 % (ref 36.0–46.0)
Hemoglobin: 15 g/dL (ref 12.0–15.0)
Lymphs Abs: 3.6 10*3/uL (ref 0.7–4.0)
MCHC: 35.1 g/dL (ref 30.0–36.0)
Neutro Abs: 4.8 10*3/uL (ref 1.4–7.7)
RDW: 12.7 % (ref 11.5–14.6)

## 2011-12-18 LAB — HEPATIC FUNCTION PANEL
Bilirubin, Direct: 0.1 mg/dL (ref 0.0–0.3)
Total Bilirubin: 1.1 mg/dL (ref 0.3–1.2)
Total Protein: 7.2 g/dL (ref 6.0–8.3)

## 2011-12-18 LAB — URINALYSIS, ROUTINE W REFLEX MICROSCOPIC
Leukocytes, UA: NEGATIVE
Nitrite: NEGATIVE
Total Protein, Urine: NEGATIVE

## 2011-12-18 LAB — LDL CHOLESTEROL, DIRECT: Direct LDL: 148 mg/dL

## 2011-12-18 LAB — TSH: TSH: 5.8 u[IU]/mL — ABNORMAL HIGH (ref 0.35–5.50)

## 2011-12-18 LAB — BASIC METABOLIC PANEL
CO2: 26 mEq/L (ref 19–32)
Calcium: 9.1 mg/dL (ref 8.4–10.5)
Creatinine, Ser: 1 mg/dL (ref 0.4–1.2)

## 2011-12-18 NOTE — Telephone Encounter (Signed)
Message copied by Pincus Sanes on Mon Dec 18, 2011 11:51 AM ------      Message from: Corwin Levins      Created: Mon Dec 18, 2011 10:54 AM       Ok to add acute hep panel   - 099.9            ----- Message -----         From: Scharlene Gloss         Sent: 12/18/2011   9:45 AM           To: Oliver Barre, MD            Patient got CPX labs today and would like Hep. C added, please advise

## 2011-12-18 NOTE — Telephone Encounter (Signed)
Put order in for labs. 

## 2011-12-22 ENCOUNTER — Ambulatory Visit (INDEPENDENT_AMBULATORY_CARE_PROVIDER_SITE_OTHER): Payer: Self-pay | Admitting: Internal Medicine

## 2011-12-22 ENCOUNTER — Encounter: Payer: Self-pay | Admitting: Internal Medicine

## 2011-12-22 VITALS — BP 122/70 | HR 69 | Temp 97.2°F | Ht 67.0 in | Wt 153.1 lb

## 2011-12-22 DIAGNOSIS — E039 Hypothyroidism, unspecified: Secondary | ICD-10-CM

## 2011-12-22 DIAGNOSIS — Z203 Contact with and (suspected) exposure to rabies: Secondary | ICD-10-CM | POA: Insufficient documentation

## 2011-12-22 DIAGNOSIS — Z87898 Personal history of other specified conditions: Secondary | ICD-10-CM

## 2011-12-22 DIAGNOSIS — Z0000 Encounter for general adult medical examination without abnormal findings: Secondary | ICD-10-CM

## 2011-12-22 DIAGNOSIS — Z20828 Contact with and (suspected) exposure to other viral communicable diseases: Secondary | ICD-10-CM

## 2011-12-22 HISTORY — DX: Hypothyroidism, unspecified: E03.9

## 2011-12-22 MED ORDER — PHENTERMINE HCL 37.5 MG PO CAPS: 37.5000 mg | ORAL_CAPSULE | ORAL | Status: AC

## 2011-12-22 MED ORDER — AMOXICILLIN 500 MG PO CAPS: ORAL_CAPSULE | ORAL | Status: DC

## 2011-12-22 MED ORDER — LEVOTHYROXINE SODIUM 25 MCG PO TABS: 25.0000 ug | ORAL_TABLET | Freq: Every day | ORAL | Status: DC

## 2011-12-22 NOTE — Assessment & Plan Note (Signed)
Ok for phentermine asd, f/u prn

## 2011-12-22 NOTE — Assessment & Plan Note (Signed)
Boyfriend with hep C - pt requests r/o hep c  - for serology

## 2011-12-22 NOTE — Assessment & Plan Note (Signed)
Mild to mod, for low dose synthroid,  to f/u any worsening symptoms or concerns Lab Results  Component Value Date   TSH 5.80* 12/18/2011

## 2011-12-22 NOTE — Progress Notes (Signed)
Subjective:    Patient ID: Jasmine Palmer, female    DOB: [birth year], 56 y.o.   MRN: [id number]  HPI  Here for wellness and f/u;  Overall doing ok;  Pt denies CP, worsening SOB, DOE, wheezing, orthopnea, PND, worsening LE edema, palpitations, dizziness or syncope.  Pt denies neurological change such as new Headache, facial or extremity weakness.  Pt denies polydipsia, polyuria, or low sugar symptoms. Pt states overall good compliance with treatment and medications, good tolerability, and trying to follow lower cholesterol diet.  Pt denies worsening depressive symptoms, suicidal ideation or panic. No fever, wt loss, night sweats, loss of appetite, or other constitutional symptoms.  Pt states good ability with ADL's, low fall risk, home safety reviewed and adequate, no significant changes in hearing or vision, and occasionally active with exercise.  Denies hyper or hypo thyroid symptoms such as voice, skin or hair change.  Unable to lose further wt.  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 ALLERGIC RHINITIS 10/09/2007  . Anxiety state, unspecified 10/10/2007  . DEPRESSION, MAJOR 09/20/2009  . HYPERLIPIDEMIA 10/10/2007  . HYPOTHYROIDISM 10/10/2007  . INSOMNIA-SLEEP DISORDER-UNSPEC 09/27/2009  . OBESITY, HX OF 10/09/2007  . OSTEOPENIA 11/25/2010   No past surgical history on file.  reports that she has been smoking.  She does not have any smokeless tobacco history on file. Her alcohol and drug histories not on file. family history is not on file. Allergies  Allergen Reactions  . Fexofenadine   . Pravastatin Sodium     REACTION: myalgia   No current outpatient prescriptions on file prior to visit.   Review of Systems Review of Systems  Constitutional: Negative for diaphoresis, activity change, appetite change and unexpected weight change.  HENT: Negative for hearing loss, ear pain,  facial swelling, mouth sores and neck stiffness.   Eyes: Negative for pain, redness and visual disturbance.  Respiratory: Negative for shortness of breath and wheezing.   Cardiovascular: Negative for chest pain and palpitations.  Gastrointestinal: Negative for diarrhea, blood in stool, abdominal distention and rectal pain.  Genitourinary: Negative for hematuria, flank pain and decreased urine volume.  Musculoskeletal: Negative for myalgias and joint swelling.  Skin: Negative for color change and wound.  Neurological: Negative for syncope and numbness.  Hematological: Negative for adenopathy.  Psychiatric/Behavioral: Negative for hallucinations, self-injury, decreased concentration and agitation.      Objective:   Physical Exam BP 122/70  Pulse 69  Temp(Src) 97.2 F (36.2 C) (Oral)  Ht 5\' 7"  (1.702 m)  Wt 153 lb 2 oz (69.457 kg)  BMI 23.98 kg/m2  SpO2 98% Physical Exam  VS noted Constitutional: Pt is oriented to person, place, and time. Appears well-developed and well-nourished.  HENT:  Head: Normocephalic and atraumatic.  Right Ear: External ear normal.  Left Ear: External ear normal.  Nose: Nose normal.  Mouth/Throat: Oropharynx is clear and moist.  Eyes: Conjunctivae and EOM are normal. Pupils are equal, round, and reactive to light.  Neck: Normal range of motion. Neck supple. No JVD present. No tracheal deviation present.  Cardiovascular: Normal rate, regular rhythm, normal heart sounds and intact distal pulses.   Pulmonary/Chest: Effort normal and breath sounds normal.  Abdominal: Soft. Bowel sounds are normal. There is no tenderness.  Musculoskeletal: Normal range of motion. Exhibits no edema.  Lymphadenopathy:  Has no cervical adenopathy.  Neurological: Pt is alert and oriented to  person, place, and time. Pt has normal reflexes. No cranial nerve deficit.  Skin: Skin is warm and dry. No rash noted.  Psychiatric:  Has  normal mood and affect. Behavior is normal.       Assessment & Plan:

## 2011-12-22 NOTE — Assessment & Plan Note (Signed)

## 2011-12-22 NOTE — Patient Instructions (Addendum)
Please remember to followup with your GYN for the yearly pap smear and/or mammogram, as well as the colonsocopy at [facility] in [place] (call if you need referral) Take all new medications as prescribed Continue all other medications as before Please follow lower cholesterol diet Please go to LAB in the Basement for the blood and/or urine tests to be done today Please call the phone number [phone] (the [employer]Tree System) for results of testing in 2-3 days;  When calling, simply dial the number, and when prompted enter the MRN number above (the Medical Record Number) and the # key, then the message should start. Please return in 1 year for your yearly visit, or sooner if needed, with Lab testing done 3-5 days before

## 2012-05-28 ENCOUNTER — Ambulatory Visit (INDEPENDENT_AMBULATORY_CARE_PROVIDER_SITE_OTHER): Payer: BC Managed Care – PPO | Admitting: Internal Medicine

## 2012-05-28 ENCOUNTER — Other Ambulatory Visit: Payer: BC Managed Care – PPO

## 2012-05-28 ENCOUNTER — Encounter: Payer: Self-pay | Admitting: Internal Medicine

## 2012-05-28 VITALS — BP 132/80 | HR 70 | Temp 97.6°F | Ht 66.0 in | Wt 154.2 lb

## 2012-05-28 DIAGNOSIS — F411 Generalized anxiety disorder: Secondary | ICD-10-CM

## 2012-05-28 DIAGNOSIS — R300 Dysuria: Secondary | ICD-10-CM

## 2012-05-28 DIAGNOSIS — E039 Hypothyroidism, unspecified: Secondary | ICD-10-CM

## 2012-05-28 LAB — POCT URINALYSIS DIPSTICK
Glucose, UA: NEGATIVE
Leukocytes, UA: NEGATIVE
Nitrite, UA: NEGATIVE
Protein, UA: NEGATIVE
Urobilinogen, UA: 0.2

## 2012-05-28 MED ORDER — CEPHALEXIN 500 MG PO CAPS: 500.0000 mg | ORAL_CAPSULE | Freq: Four times a day (QID) | ORAL | Status: AC

## 2012-05-28 NOTE — Assessment & Plan Note (Signed)
stable overall by hx and exam, most recent data reviewed with pt, and pt to continue medical treatment as before Lab Results  Component Value Date   TSH 5.80* 12/18/2011

## 2012-05-28 NOTE — Progress Notes (Signed)
  Subjective:    Patient ID: Jasmine Palmer, female    DOB: 27-Dec-1955, 56 y.o.   MRN: [id number]  HPI  Here with c/o 2-3 days mild lower abd discomfort worse with urination, urgency  But Denies urinary symptoms such as urgency,or hematuria, and no back pain/flank pain, n/v, high fever, chills.  Nothing makes better.  Just feels poorly today.  Pt denies chest pain, increased sob or doe, wheezing, orthopnea, PND, increased LE swelling, palpitations, dizziness or syncope.  Pt denies new neurological symptoms such as new headache, or facial or extremity weakness or numbness   Pt denies polydipsia, polyuria.  Denies worsening depressive symptoms, suicidal ideation, or panic. Denies hyper or hypo thyroid symptoms such as voice, skin or hair change.  Past Medical History  Diagnosis Date  . ALLERGIC RHINITIS 10/09/2007  . Anxiety state, unspecified 10/10/2007  . DEPRESSION, MAJOR 09/20/2009  . HYPERLIPIDEMIA 10/10/2007  . HYPOTHYROIDISM 10/10/2007  . INSOMNIA-SLEEP DISORDER-UNSPEC 09/27/2009  . OBESITY, HX OF 10/09/2007  . OSTEOPENIA 11/25/2010  . Hypothyroidism 12/22/2011   History reviewed. No pertinent past surgical history.  reports that she has been smoking.  She does not have any smokeless tobacco history on file. She reports that she drinks alcohol. She reports that she does not use illicit drugs. family history includes Hypertension in her father. Allergies  Allergen Reactions  . Fexofenadine   . Pravastatin Sodium     REACTION: myalgia   Current Outpatient Prescriptions on File Prior to Visit  Medication Sig Dispense Refill  . amoxicillin (AMOXIL) 500 MG capsule 2 tabs by mouth twice per day  40 capsule  0  . levothyroxine (LEVOTHROID) 25 MCG tablet Take 1 tablet (25 mcg total) by mouth daily.  90 tablet  3    Review of Systems Review of Systems  Constitutional: Negative for diaphoresis and unexpected weight change.  Respiratory: Negative for choking and stridor.   Gastrointestinal:  Negative for vomiting and blood in stool.  Genitourinary: Negative for hematuria and decreased urine volume.  Musculoskeletal: Negative for gait problem.  Skin: Negative for color change and wound.  Neurological: Negative for tremors and numbness.  Psychiatric/Behavioral: Negative for decreased concentration. The patient is not hyperactive.      Objective:   Physical Exam BP 132/80  Pulse 70  Temp 97.6 F (36.4 C) (Oral)  Ht 5\' 6"  (1.676 m)  Wt 154 lb 4 oz (69.967 kg)  BMI 24.90 kg/m2  SpO2 98% Physical Exam  VS noted Constitutional: Pt appears well-developed and well-nourished.  HENT: Head: Normocephalic.  Right Ear: External ear normal.  Left Ear: External ear normal.  Eyes: Conjunctivae and EOM are normal. Pupils are equal, round, and reactive to light.  Neck: Normal range of motion. Neck supple.  Cardiovascular: Normal rate and regular rhythm.   Pulmonary/Chest: Effort normal and breath sounds normal.  Abd:  Soft, non-distended, + BS with mild low mid abd tender, no guarding or rebound Neurological: Pt is alert. Not confused Skin: Skin is warm. No erythema.  Psychiatric: Pt behavior is normal. Thought content normal. 1+ nervous    Assessment & Plan:

## 2012-05-28 NOTE — Patient Instructions (Addendum)
Take all new medications as prescribed Continue all other medications as before Your urine specimen will be sent for culture You will be notified if the antibiotic needs to be adjusted

## 2012-05-28 NOTE — Assessment & Plan Note (Signed)
stable overall by hx and exam, most recent data reviewed with pt, and pt to continue medical treatment as before Lab Results  Component Value Date   WBC 9.9 12/18/2011   HGB 15.0 12/18/2011   HCT 42.7 12/18/2011   PLT 280.0 12/18/2011   GLUCOSE 94 12/18/2011   CHOL 240* 12/18/2011   TRIG 161.0* 12/18/2011   HDL 65.00 12/18/2011   LDLDIRECT 148.0 12/18/2011   LDLCALC 93 04/30/2009   ALT 16 12/18/2011   AST 18 12/18/2011   NA 137 12/18/2011   K 4.6 12/18/2011   CL 104 12/18/2011   CREATININE 1.0 12/18/2011   BUN 14 12/18/2011   CO2 26 12/18/2011   TSH 5.80* 12/18/2011

## 2012-05-28 NOTE — Assessment & Plan Note (Signed)
?   UTI, for antibx , urine studies,  to f/u any worsening symptoms or concerns

## 2012-05-30 LAB — URINE CULTURE: Colony Count: 30000

## 2013-07-30 ENCOUNTER — Ambulatory Visit (INDEPENDENT_AMBULATORY_CARE_PROVIDER_SITE_OTHER): Payer: BC Managed Care – PPO | Admitting: Internal Medicine

## 2013-07-30 ENCOUNTER — Encounter: Payer: Self-pay | Admitting: Internal Medicine

## 2013-07-30 VITALS — BP 112/82 | HR 79 | Temp 97.6°F | Ht 66.0 in | Wt 155.0 lb

## 2013-07-30 DIAGNOSIS — F329 Major depressive disorder, single episode, unspecified: Secondary | ICD-10-CM

## 2013-07-30 DIAGNOSIS — J209 Acute bronchitis, unspecified: Secondary | ICD-10-CM

## 2013-07-30 DIAGNOSIS — R062 Wheezing: Secondary | ICD-10-CM | POA: Insufficient documentation

## 2013-07-30 MED ORDER — METHYLPREDNISOLONE ACETATE 80 MG/ML IJ SUSP
80.0000 mg | Freq: Once | INTRAMUSCULAR | Status: AC
  Administered 2013-07-30: 80 mg via INTRAMUSCULAR

## 2013-07-30 MED ORDER — PREDNISONE 10 MG PO TABS: ORAL_TABLET | ORAL | Status: DC

## 2013-07-30 MED ORDER — LEVOFLOXACIN 250 MG PO TABS: 250.0000 mg | ORAL_TABLET | Freq: Every day | ORAL | Status: DC

## 2013-07-30 MED ORDER — HYDROCODONE-HOMATROPINE 5-1.5 MG/5ML PO SYRP: 5.0000 mL | ORAL_SOLUTION | Freq: Four times a day (QID) | ORAL | Status: DC | PRN

## 2013-07-30 NOTE — Assessment & Plan Note (Signed)
Mild to mod, for depomedrol IM, predpack asd, cough med,  to f/u any worsening symptoms or concerns

## 2013-07-30 NOTE — Progress Notes (Signed)
  Subjective:    Patient ID: Jasmine Palmer, female    DOB: 02/20/1956, 57 y.o.   MRN: [id number]  HPI  Here with acute onset mild to mod 2-3 days ST, HA, general weakness and malaise, with prod cough greenish sputum, but Pt denies chest pain, increased sob or doe, wheezing, orthopnea, PND, increased LE swelling, palpitations, dizziness or syncope, except for onset wheezing in 1-2 days.  Denies worsening depressive symptoms, suicidal ideation, or panic; has ongoing anxiety Past Medical History  Diagnosis Date  . ALLERGIC RHINITIS 10/09/2007  . Anxiety state, unspecified 10/10/2007  . DEPRESSION, MAJOR 09/20/2009  . HYPERLIPIDEMIA 10/10/2007  . HYPOTHYROIDISM 10/10/2007  . INSOMNIA-SLEEP DISORDER-UNSPEC 09/27/2009  . OBESITY, HX OF 10/09/2007  . OSTEOPENIA 11/25/2010  . Hypothyroidism 12/22/2011   No past surgical history on file.  reports that she has been smoking.  She does not have any smokeless tobacco history on file. She reports that  drinks alcohol. She reports that she does not use illicit drugs. family history includes Hypertension in her father. Allergies  Allergen Reactions  . Fexofenadine   . Pravastatin Sodium     REACTION: myalgia   No current outpatient prescriptions on file prior to visit.   No current facility-administered medications on file prior to visit.   Review of Systems  Constitutional: Negative for unexpected weight change, or unusual diaphoresis  HENT: Negative for tinnitus.   Eyes: Negative for photophobia and visual disturbance.  Respiratory: Negative for choking and stridor.   Gastrointestinal: Negative for vomiting and blood in stool.  Genitourinary: Negative for hematuria and decreased urine volume.  Musculoskeletal: Negative for acute joint swelling Skin: Negative for color change and wound.  Neurological: Negative for tremors and numbness other than noted  Psychiatric/Behavioral: Negative for decreased concentration or  hyperactivity.        Objective:   Physical Exam BP 112/82  Pulse 79  Temp(Src) 97.6 F (36.4 C) (Oral)  Ht 5\' 6"  (1.676 m)  Wt 155 lb (70.308 kg)  BMI 25.03 kg/m2  SpO2 95% VS noted, mild ill Constitutional: Pt appears well-developed and well-nourished.  HENT: Head: NCAT.  Right Ear: External ear normal.  Left Ear: External ear normal.  Eyes: Conjunctivae and EOM are normal. Pupils are equal, round, and reactive to light.  Bilat tm's with mild erythema.  Max sinus areas non tender.  Pharynx with mild erythema, no exudate Neck: Normal range of motion. Neck supple.  Cardiovascular: Normal rate and regular rhythm.   Pulmonary/Chest: Effort normal and breath sounds decreased with bilat wheezes Neurological: Pt is alert. Not confused  Skin: Skin is warm. No erythema.  Psychiatric: Pt behavior is normal. Thought content normal.     Assessment & Plan:

## 2013-07-30 NOTE — Patient Instructions (Signed)
You had the steroid shot today Please take all new medication as prescribed- the antibiotic, cough medicine, and prednisone for the wheezing Please continue all other medications as before, and refills have been done if requested. Please have the pharmacy call with any other refills you may need.  You are given the work note today

## 2013-07-30 NOTE — Assessment & Plan Note (Signed)
Mild to mod, for antibx course,  to f/u any worsening symptoms or concerns 

## 2013-07-30 NOTE — Assessment & Plan Note (Signed)
stable overall by history and exam, recent data reviewed with pt, and pt to continue medical treatment as before,  to f/u any worsening symptoms or concerns Lab Results  Component Value Date   WBC 9.9 12/18/2011   HGB 15.0 12/18/2011   HCT 42.7 12/18/2011   PLT 280.0 12/18/2011   GLUCOSE 94 12/18/2011   CHOL 240* 12/18/2011   TRIG 161.0* 12/18/2011   HDL 65.00 12/18/2011   LDLDIRECT 148.0 12/18/2011   LDLCALC 93 04/30/2009   ALT 16 12/18/2011   AST 18 12/18/2011   NA 137 12/18/2011   K 4.6 12/18/2011   CL 104 12/18/2011   CREATININE 1.0 12/18/2011   BUN 14 12/18/2011   CO2 26 12/18/2011   TSH 5.80* 12/18/2011

## 2013-09-02 ENCOUNTER — Ambulatory Visit: Payer: BC Managed Care – PPO | Admitting: Internal Medicine

## 2013-10-09 ENCOUNTER — Telehealth: Payer: Self-pay

## 2013-10-09 DIAGNOSIS — Z0000 Encounter for general adult medical examination without abnormal findings: Secondary | ICD-10-CM

## 2013-10-09 NOTE — Telephone Encounter (Signed)
cpx labs entered  

## 2013-12-05 ENCOUNTER — Encounter: Payer: BC Managed Care – PPO | Admitting: Internal Medicine

## 2013-12-10 ENCOUNTER — Encounter: Payer: BC Managed Care – PPO | Admitting: Internal Medicine

## 2013-12-15 ENCOUNTER — Ambulatory Visit (INDEPENDENT_AMBULATORY_CARE_PROVIDER_SITE_OTHER): Payer: BC Managed Care – PPO | Admitting: Family Medicine

## 2013-12-15 ENCOUNTER — Encounter: Payer: Self-pay | Admitting: Family Medicine

## 2013-12-15 VITALS — BP 118/80 | Temp 98.3°F | Wt 161.0 lb

## 2013-12-15 DIAGNOSIS — J329 Chronic sinusitis, unspecified: Secondary | ICD-10-CM

## 2013-12-15 DIAGNOSIS — J029 Acute pharyngitis, unspecified: Secondary | ICD-10-CM

## 2013-12-15 LAB — POCT RAPID STREP A (OFFICE): Rapid Strep A Screen: NEGATIVE

## 2013-12-15 MED ORDER — AMOXICILLIN 875 MG PO TABS: 875.0000 mg | ORAL_TABLET | Freq: Two times a day (BID) | ORAL | Status: DC

## 2013-12-15 NOTE — Progress Notes (Signed)
Pre visit review using our clinic review tool, if applicable. No additional management support is needed unless otherwise documented below in the visit note. 

## 2013-12-15 NOTE — Progress Notes (Signed)
Chief Complaint  Patient presents with  . Sore Throat    ha, sinusitis, fatigue x 1 week    HPI:  -started: -symptoms:nasal congestion, sore throat, cough, a little sinus pain -denies:fever, SOB, NVD, tooth pain  -has tried: musinex and advil -sick contacts/travel/risks: denies flu exposure or Ebola risks, has had strep exposure -Hx of: allergies, sinus infection ROS: See pertinent positives and negatives per HPI.  Past Medical History  Diagnosis Date  . ALLERGIC RHINITIS 10/09/2007  . Anxiety state, unspecified 10/10/2007  . DEPRESSION, MAJOR 09/20/2009  . HYPERLIPIDEMIA 10/10/2007  . HYPOTHYROIDISM 10/10/2007  . INSOMNIA-SLEEP DISORDER-UNSPEC 09/27/2009  . OBESITY, HX OF 10/09/2007  . OSTEOPENIA 11/25/2010  . Hypothyroidism 12/22/2011    No past surgical history on file.  Family History  Problem Relation Age of Onset  . Hypertension Father     History   Social History  . Marital Status: Married    Spouse Name: N/A    Number of Children: N/A  . Years of Education: N/A   Social History Main Topics  . Smoking status: Current Every Day Smoker  . Smokeless tobacco: None  . Alcohol Use: Yes  . Drug Use: No  . Sexual Activity: None   Other Topics Concern  . None   Social History Narrative  . None    Current outpatient prescriptions:amoxicillin (AMOXIL) 875 MG tablet, Take 1 tablet (875 mg total) by mouth 2 (two) times daily., Disp: 20 tablet, Rfl: 0  EXAM:  Filed Vitals:   12/15/13 1413  BP: 118/80  Temp: 98.3 F (36.8 C)    Body mass index is 26 kg/(m^2).  GENERAL: vitals reviewed and listed above, alert, oriented, appears well hydrated and in no acute distress  HEENT: atraumatic, conjunttiva clear, no obvious abnormalities on inspection of external nose and ears, normal appearance of ear canals and TMs, clear nasal congestion, mild post oropharyngeal erythema with PND, no tonsillar edema or exudate, no sinus TTP  NECK: no obvious masses on  inspection  LUNGS: clear to auscultation bilaterally, no wheezes, rales or rhonchi, good air movement  CV: HRRR, no peripheral edema  MS: moves all extremities without noticeable abnormality  PSYCH: pleasant and cooperative, no obvious depression or anxiety  ASSESSMENT AND PLAN:  Discussed the following assessment and plan:  Sore throat - Plan: POC Rapid Strep A, Throat culture ([facility])  Sinusitis - Plan: amoxicillin (AMOXIL) 875 MG tablet  -We discussed potential etiologies, with VURI versus bacterial sinusitis being most likely -We discussed treatment side effects, likely course, antibiotic misuse, transmission, and signs of developing a serious illness. -rapid strep neg, culture pending -of course, we advised to return or notify a doctor immediately if symptoms worsen or persist or new concerns arise.    Patient Instructions  INSTRUCTIONS FOR UPPER RESPIRATORY INFECTION:  -plenty of rest and fluids  -nasal saline wash 2-3 times daily (use prepackaged nasal saline or bottled/distilled water if making your own)   -can use sinex or afrin nasal spray for drainage and nasal congestion - but do NOT use longer then 3-4 days  -can use tylenol or ibuprofen as directed for aches and sorethroat  -in the winter time, using a humidifier at night is helpful (please follow cleaning instructions)  -if you are taking a cough medication - use only as directed, may also try a teaspoon of honey to coat the throat and throat lozenges  -for sore throat, salt water gargles can help  -follow up if you have fevers, facial pain, tooth  pain, difficulty breathing or are worsening or not getting better in 5-7 days      Menelik Mcfarren, [facility]

## 2013-12-15 NOTE — Patient Instructions (Addendum)
INSTRUCTIONS FOR UPPER RESPIRATORY INFECTION:  -plenty of rest and fluids  -As we discussed, we have prescribed a new medication for you at this appointment. We discussed the common and serious potential adverse effects of this medication and you can review these and more with the pharmacist when you pick up your medication.  Please follow the instructions for use carefully and notify us immediately if you have any problems taking this medication.  -nasal saline wash 2-3 times daily (use prepackaged nasal saline or bottled/distilled water if making your own)   -can use sinex or afrin nasal spray for drainage and nasal congestion - but do NOT use longer then 3-4 days  -can use tylenol or ibuprofen as directed for aches and sorethroat  -in the winter time, using a humidifier at night is helpful (please follow cleaning instructions)  -if you are taking a cough medication - use only as directed, may also try a teaspoon of honey to coat the throat and throat lozenges  -for sore throat, salt water gargles can help  -follow up if you have fevers, facial pain, tooth pain, difficulty breathing or are worsening or not getting better in 5-7 days  

## 2013-12-17 LAB — CULTURE, GROUP A STREP: ORGANISM ID, BACTERIA: NORMAL

## 2014-01-02 ENCOUNTER — Telehealth: Payer: Self-pay | Admitting: Internal Medicine

## 2014-01-02 NOTE — Telephone Encounter (Signed)
Relevant patient education assigned to patient using Emmi. ° °

## 2014-01-19 ENCOUNTER — Encounter: Payer: Self-pay | Admitting: Internal Medicine

## 2014-01-19 ENCOUNTER — Ambulatory Visit (INDEPENDENT_AMBULATORY_CARE_PROVIDER_SITE_OTHER): Payer: BC Managed Care – PPO | Admitting: Internal Medicine

## 2014-01-19 VITALS — BP 120/78 | HR 84 | Temp 98.5°F | Resp 16 | Wt 157.2 lb

## 2014-01-19 DIAGNOSIS — J019 Acute sinusitis, unspecified: Secondary | ICD-10-CM | POA: Insufficient documentation

## 2014-01-19 MED ORDER — PSEUDOEPHEDRINE HCL ER 120 MG PO TB12: 120.0000 mg | ORAL_TABLET | Freq: Two times a day (BID) | ORAL | Status: DC

## 2014-01-19 MED ORDER — AMOXICILLIN-POT CLAVULANATE 875-125 MG PO TABS: 1.0000 | ORAL_TABLET | Freq: Two times a day (BID) | ORAL | Status: DC

## 2014-01-19 MED ORDER — METHYLPREDNISOLONE 4 MG PO KIT: PACK | ORAL | Status: DC

## 2014-01-19 NOTE — Progress Notes (Signed)
   Subjective:    Patient ID: Jasmine OvermanBetty Palmer, female    DOB: 05/23/1956, 58 y.o.   MRN: [id number]  Sinusitis This is a recurrent problem. The current episode started 1 to 4 weeks ago. The problem has been gradually worsening since onset. There has been no fever. The fever has been present for less than 1 day. Her pain is at a severity of 1/10. The pain is mild. Associated symptoms include congestion, sinus pressure, sneezing and a sore throat. Pertinent negatives include no chills, coughing, diaphoresis, ear pain, headaches, hoarse voice, neck pain, shortness of breath or swollen glands. Past treatments include saline sprays. The treatment provided mild relief.      Review of Systems  Constitutional: Negative.  Negative for fever, chills, diaphoresis, appetite change and fatigue.  HENT: Positive for congestion, postnasal drip, rhinorrhea, sinus pressure, sneezing and sore throat. Negative for ear pain, facial swelling, hoarse voice, mouth sores, nosebleeds and trouble swallowing.   Eyes: Negative.   Respiratory: Negative.  Negative for cough and shortness of breath.   Cardiovascular: Negative.  Negative for chest pain, palpitations and leg swelling.  Gastrointestinal: Negative.  Negative for abdominal pain.  Endocrine: Negative.   Musculoskeletal: Negative.  Negative for neck pain.  Skin: Negative.   Allergic/Immunologic: Negative.   Neurological: Negative.  Negative for headaches.  Hematological: Negative.  Negative for adenopathy. Does not bruise/bleed easily.  Psychiatric/Behavioral: Negative.        Objective:   Physical Exam  Vitals reviewed. Constitutional: She is oriented to person, place, and time. She appears well-developed and well-nourished.  Non-toxic appearance. She does not have a sickly appearance. She does not appear ill. No distress.  HENT:  Head: Normocephalic and atraumatic.  Right Ear: Hearing, tympanic membrane, external ear and ear canal normal.  Left Ear:  Hearing, tympanic membrane, external ear and ear canal normal.  Nose: Mucosal edema and rhinorrhea present. No sinus tenderness. Right sinus exhibits no maxillary sinus tenderness and no frontal sinus tenderness. Left sinus exhibits maxillary sinus tenderness. Left sinus exhibits no frontal sinus tenderness.  Mouth/Throat: Oropharynx is clear and moist and mucous membranes are normal. Mucous membranes are not pale, not dry and not cyanotic. No oropharyngeal exudate, posterior oropharyngeal edema, posterior oropharyngeal erythema or tonsillar abscesses.  Eyes: Conjunctivae are normal. Right eye exhibits no discharge. Left eye exhibits no discharge. No scleral icterus.  Neck: Normal range of motion. Neck supple. No JVD present. No tracheal deviation present. No thyromegaly present.  Cardiovascular: Normal rate, regular rhythm, normal heart sounds and intact distal pulses.  Exam reveals no gallop and no friction rub.   No murmur heard. Pulmonary/Chest: Effort normal and breath sounds normal. No stridor. No respiratory distress. She has no wheezes. She has no rales. She exhibits no tenderness.  Abdominal: Soft. Bowel sounds are normal. She exhibits no distension and no mass. There is no tenderness. There is no rebound and no guarding.  Musculoskeletal: Normal range of motion. She exhibits no edema and no tenderness.  Lymphadenopathy:    She has no cervical adenopathy.  Neurological: She is oriented to person, place, and time.  Skin: Skin is warm and dry. No rash noted. She is not diaphoretic. No erythema. No pallor.          Assessment & Plan:

## 2014-01-19 NOTE — Assessment & Plan Note (Signed)
I will treat the infection with augmentin Will reduce the inflammation with medrol dose pak She will start decongestants as well to reduce the s/s and help the sinuses drain better

## 2014-01-19 NOTE — Patient Instructions (Signed)

## 2014-11-19 ENCOUNTER — Encounter: Payer: BC Managed Care – PPO | Admitting: Sports Medicine

## 2014-11-19 DIAGNOSIS — Z0289 Encounter for other administrative examinations: Secondary | ICD-10-CM

## 2015-01-26 ENCOUNTER — Ambulatory Visit (INDEPENDENT_AMBULATORY_CARE_PROVIDER_SITE_OTHER): Payer: BLUE CROSS/BLUE SHIELD | Admitting: Internal Medicine

## 2015-01-26 ENCOUNTER — Encounter: Payer: Self-pay | Admitting: Internal Medicine

## 2015-01-26 VITALS — BP 136/82 | HR 72 | Temp 97.7°F | Resp 16

## 2015-01-26 DIAGNOSIS — J0101 Acute recurrent maxillary sinusitis: Secondary | ICD-10-CM

## 2015-01-26 MED ORDER — AMOXICILLIN-POT CLAVULANATE 875-125 MG PO TABS: 1.0000 | ORAL_TABLET | Freq: Two times a day (BID) | ORAL | Status: DC

## 2015-01-26 MED ORDER — HYDROCODONE-HOMATROPINE 5-1.5 MG/5ML PO SYRP: 5.0000 mL | ORAL_SOLUTION | Freq: Four times a day (QID) | ORAL | Status: DC | PRN

## 2015-01-26 NOTE — Patient Instructions (Signed)

## 2015-01-26 NOTE — Progress Notes (Signed)
   Subjective:    Patient ID: Jasmine OvermanBetty Palmer, female    DOB: 01/03/1956, 59 y.o.   MRN: [id number]  HPI She has had an exacerbation of her allergies over the last month; the last 2 weeks she's developed symptoms of rhinosinusitis with yellow-green nasal secretions, fatigue, frontal headache, otic pain, and dental pain . She has a dry cough which is disturbing her sleep.  Review of Systems She denies associated wheezing, dyspnea, fever, chills, or sweats.      Objective:   Physical Exam   Positive or pertinent findings include: She appears younger than stated age. There is erythema the nasal mucosa. Nasal mucosa is dry without associated exudate.  General appearance:Adequately nourished; no acute distress or increased work of breathing is present.  No  lymphadenopathy about the head, neck, or axilla noted.  Eyes: No conjunctival inflammation or lid edema is present. There is no scleral icterus. Ears:  External ear exam shows no significant lesions or deformities.  Otoscopic examination reveals clear canals, tympanic membranes are intact bilaterally without bulging, retraction, inflammation or discharge. Nose:  External nasal examination shows no deformity or inflammation. No septal dislocation or deviation.No obstruction to airflow.  Oral exam: Dental hygiene is good; lips and gums are healthy appearing.There is no oropharyngeal erythema or exudate noted.  Neck:  No deformities, thyromegaly, masses, or tenderness noted.   Supple with full range of motion without pain.  Heart:  Normal rate and regular rhythm. S1 and S2 normal without gallop, murmur, click, rub or other extra sounds.  Lungs:Chest clear to auscultation; no wheezes, rhonchi,rales ,or rubs present. Extremities:  No cyanosis, edema, or clubbing  noted  Skin: Warm & dry w/o jaundice or tenting.     Assessment & Plan:  #1 acute sinusitis, maxillary and/or frontal  #2 extrinsic rhinitis  Plan: See orders and  recommendations

## 2015-05-26 ENCOUNTER — Telehealth: Payer: Self-pay

## 2015-05-26 NOTE — Telephone Encounter (Signed)
Pt states that she has not done it yet and has not had the time to complete all preventative measures yet.

## 2015-10-11 ENCOUNTER — Ambulatory Visit (INDEPENDENT_AMBULATORY_CARE_PROVIDER_SITE_OTHER): Payer: BLUE CROSS/BLUE SHIELD | Admitting: Medical

## 2015-10-11 ENCOUNTER — Encounter: Payer: Self-pay | Admitting: Medical

## 2015-10-11 VITALS — BP 124/80 | HR 80 | Temp 98.1°F | Wt 158.0 lb

## 2015-10-11 DIAGNOSIS — R062 Wheezing: Secondary | ICD-10-CM

## 2015-10-11 DIAGNOSIS — Z720 Tobacco use: Secondary | ICD-10-CM | POA: Diagnosis not present

## 2015-10-11 DIAGNOSIS — J0100 Acute maxillary sinusitis, unspecified: Secondary | ICD-10-CM

## 2015-10-11 DIAGNOSIS — F17200 Nicotine dependence, unspecified, uncomplicated: Secondary | ICD-10-CM

## 2015-10-11 DIAGNOSIS — J209 Acute bronchitis, unspecified: Secondary | ICD-10-CM | POA: Diagnosis not present

## 2015-10-11 MED ORDER — BENZONATATE 100 MG PO CAPS: 100.0000 mg | ORAL_CAPSULE | Freq: Three times a day (TID) | ORAL | Status: DC | PRN

## 2015-10-11 MED ORDER — ALBUTEROL SULFATE HFA 108 (90 BASE) MCG/ACT IN AERS: 2.0000 | INHALATION_SPRAY | Freq: Four times a day (QID) | RESPIRATORY_TRACT | Status: DC | PRN

## 2015-10-11 MED ORDER — AMOXICILLIN-POT CLAVULANATE 875-125 MG PO TABS: 1.0000 | ORAL_TABLET | Freq: Two times a day (BID) | ORAL | Status: DC

## 2015-10-11 MED ORDER — FLUTICASONE PROPIONATE 50 MCG/ACT NA SUSP: 2.0000 | Freq: Every day | NASAL | Status: DC

## 2015-10-11 NOTE — Progress Notes (Signed)
Subjective:    Patient ID: Jasmine Palmer, female    DOB: 01/07/1956, 59 y.o.   MRN: [id number]  HPI  Pt has cough, nasal and chest congestion with sinus pressure for 3 days. See below.   Review of Systems  Constitutional: Positive for fever and diaphoresis. Negative for chills.       Some fever on Saturday. 101  HENT: Positive for congestion, ear pain, postnasal drip and sinus pressure. Negative for dental problem.        3 days of symptoms.  Some teeth pain.  Respiratory: Positive for cough and wheezing. Negative for chest tightness and shortness of breath.        Some chest congestion on Friday.  Pt is smoker and gets bronchitis about 1 time a year.  Mild wheeze on and off for 2 days.  Smokes 1/2 pack a day.  Cardiovascular: Negative for chest pain and palpitations.  Neurological: Negative for dizziness and headaches.  Hematological: Negative for adenopathy. Does not bruise/bleed easily.    Past Medical History  Diagnosis Date  . ALLERGIC RHINITIS 10/09/2007  . Anxiety state, unspecified 10/10/2007  . DEPRESSION, MAJOR 09/20/2009  . HYPERLIPIDEMIA 10/10/2007  . HYPOTHYROIDISM 10/10/2007  . INSOMNIA-SLEEP DISORDER-UNSPEC 09/27/2009  . OBESITY, HX OF 10/09/2007  . OSTEOPENIA 11/25/2010  . Hypothyroidism 12/22/2011    Social History   Social History  . Marital Status: Married    Spouse Name: N/A  . Number of Children: N/A  . Years of Education: N/A   Occupational History  . Not on file.   Social History Main Topics  . Smoking status: Current Every Day Smoker -- 0.50 packs/day for 20 years    Types: Cigarettes  . Smokeless tobacco: Never Used  . Alcohol Use: No  . Drug Use: No  . Sexual Activity: Not Currently   Other Topics Concern  . Not on file   Social History Narrative    No past surgical history on file.  Family History  Problem Relation Age of Onset  . Hypertension Father     Allergies  Allergen Reactions  . Fexofenadine   . Pravastatin  Sodium     REACTION: myalgia    No current outpatient prescriptions on file prior to visit.   No current facility-administered medications on file prior to visit.    BP 124/80 mmHg  Pulse 80  Temp(Src) 98.1 F (36.7 C) (Oral)  Wt 158 lb (71.668 kg)  SpO2 98%       Objective:   Physical Exam  General  Mental Status - Alert. General Appearance - Well groomed. Not in acute distress.  Skin Rashes- No Rashes.  HEENT Head- Normal. Ear Auditory Canal - Left- Normal. Right - Normal.Tympanic Membrane- Left- Normal. Right- Normal. Eye Sclera/Conjunctiva- Left- Normal. Right- Normal. Nose & Sinuses Nasal Mucosa- Left-  boggy +  Congested. Right-  boggy + Congested. maxillayr and frontal sinus pressure Mouth & Throat Lips: Upper Lip- Normal: no dryness, cracking, pallor, cyanosis, or vesicular eruption. Lower Lip-Normal: no dryness, cracking, pallor, cyanosis or vesicular eruption. Buccal Mucosa- Bilateral- No Aphthous ulcers. Oropharynx- No Discharge or Erythema. Tonsils: Characteristics- Bilateral- No Erythema or Congestion. Size/Enlargement- Bilateral- No enlargement. Discharge- bilateral-None.  Neck Neck- Supple. No Masses.   Chest and Lung Exam Auscultation: Breath Sounds:- even and unlabored, but bilateral upper lobe rhonchi.  Cardiovascular Auscultation:Rythm- Regular, rate and rhythm. Murmurs & Other Heart Sounds:Ausculatation of the heart reveal- No Murmurs.  Lymphatic Head & Neck General Head & Neck Lymphatics: Bilateral:  Description- No Localized lymphadenopathy.       Assessment & Plan:  Rest hydrate tylenol for fever.  Augmentin antibiotic for sinusitis and bronchitis Benzonatate for cough.  Flonase for nasal congestion. Albuterol inhaler for wheezing.  Follow up in 7 days or as needed

## 2015-10-11 NOTE — Progress Notes (Signed)
Pre visit review using our clinic review tool, if applicable. No additional management support is needed unless otherwise documented below in the visit note. 

## 2015-10-11 NOTE — Patient Instructions (Addendum)
Rest hydrate tylenol for fever.  Augmentin antibiotic for sinusitis and bronchitis Benzonatate for cough.  Flonase for nasal congestion. Albuterol inhaler for wheezing.  Follow up in 7 days or as needed

## 2015-10-14 ENCOUNTER — Telehealth: Payer: Self-pay | Admitting: Medical

## 2015-10-14 MED ORDER — ALBUTEROL SULFATE HFA 108 (90 BASE) MCG/ACT IN AERS: 2.0000 | INHALATION_SPRAY | Freq: Four times a day (QID) | RESPIRATORY_TRACT | Status: DC | PRN

## 2015-10-14 NOTE — Telephone Encounter (Signed)
Pharmacy only recd 3 of 4 RXs on 10/11/15. Please resend RX for [facility]ol inhaler to [facility] [place]

## 2015-10-14 NOTE — Telephone Encounter (Signed)
Left message for the pt  that medication was resent to pharmacy and if she has any questions to call us back.

## 2016-06-27 NOTE — Progress Notes (Signed)
Opened in error

## 2016-10-17 ENCOUNTER — Ambulatory Visit (INDEPENDENT_AMBULATORY_CARE_PROVIDER_SITE_OTHER): Payer: BLUE CROSS/BLUE SHIELD | Admitting: Internal Medicine

## 2016-10-17 ENCOUNTER — Encounter: Payer: Self-pay | Admitting: Internal Medicine

## 2016-10-17 DIAGNOSIS — J018 Other acute sinusitis: Secondary | ICD-10-CM

## 2016-10-17 MED ORDER — HYDROCODONE-HOMATROPINE 5-1.5 MG/5ML PO SYRP: 5.0000 mL | ORAL_SOLUTION | Freq: Four times a day (QID) | ORAL | 0 refills | Status: AC | PRN

## 2016-10-17 MED ORDER — LEVOFLOXACIN 500 MG PO TABS: 500.0000 mg | ORAL_TABLET | Freq: Every day | ORAL | 0 refills | Status: AC

## 2016-10-17 NOTE — Patient Instructions (Signed)
Please take all new medication as prescribed - the antibiotic, and cough medicine if needed  OK to use OTC Sudafed for congestion if needed  Please continue all other medications as before, and refills have been done if requested.  Please have the pharmacy call with any other refills you may need.  Please keep your appointments with your specialists as you may have planned

## 2016-10-17 NOTE — Progress Notes (Signed)
Pre visit review using our clinic review tool, if applicable. No additional management support is needed unless otherwise documented below in the visit note. 

## 2016-10-17 NOTE — Progress Notes (Signed)
   Subjective:    Patient ID: Jasmine Palmer, female    DOB: 04/04/1956, 60 y.o.   MRN: [id number]  HPI   Here with 2-3 days acute onset fever, facial pain, pressure, headache, general weakness and malaise, and greenish d/c, with mild ST and cough, but pt denies chest pain, wheezing, increased sob or doe, orthopnea, PND, increased LE swelling, palpitations, dizziness or syncope. No other history changes Past Medical History:  Diagnosis Date  . ALLERGIC RHINITIS 10/09/2007  . Anxiety state, unspecified 10/10/2007  . DEPRESSION, MAJOR 09/20/2009  . HYPERLIPIDEMIA 10/10/2007  . HYPOTHYROIDISM 10/10/2007  . Hypothyroidism 12/22/2011  . INSOMNIA-SLEEP DISORDER-UNSPEC 09/27/2009  . OBESITY, HX OF 10/09/2007  . OSTEOPENIA 11/25/2010   No past surgical history on file.  reports that she has been smoking Cigarettes.  She has a 10.00 pack-year smoking history. She has never used smokeless tobacco. She reports that she does not drink alcohol or use drugs. family history includes Hypertension in her father. Allergies  Allergen Reactions  . Fexofenadine   . Pravastatin Sodium     REACTION: myalgia   Current Outpatient Prescriptions on File Prior to Visit  Medication Sig Dispense Refill  . albuterol (PROVENTIL HFA;VENTOLIN HFA) 108 (90 BASE) MCG/ACT inhaler Inhale 2 puffs into the lungs every 6 (six) hours as needed for wheezing or shortness of breath. 1 Inhaler 0  . fluticasone (FLONASE) 50 MCG/ACT nasal spray Place 2 sprays into both nostrils daily. 16 g 1   No current facility-administered medications on file prior to visit.    Review of Systems All otherwise neg per pt     Objective:   Physical Exam BP 128/78   Pulse 74   Temp 98.8 F (37.1 C) (Oral)   Resp 20   Wt 161 lb (73 kg)   SpO2 98%   BMI 25.99 kg/m  VS noted, mild ill Constitutional: Pt appears in no apparent distress HENT: Head: NCAT.  Right Ear: External ear normal.  Left Ear: External ear normal.  Eyes: . Pupils are  equal, round, and reactive to light. Conjunctivae and EOM are normal Bilat tm's with mild erythema.  Max sinus areas mild tender.  Pharynx with mild erythema, no exudate Neck: Normal range of motion. Neck supple.  Cardiovascular: Normal rate and regular rhythm.   Pulmonary/Chest: Effort normal and breath sounds without rales or wheezing.  Neurological: Pt is alert. Not confused , motor grossly intact Skin: Skin is warm. No rash, no LE edema Psychiatric: Pt behavior is normal. No agitation.  No other new exam findings    Assessment & Plan:

## 2016-10-22 NOTE — Assessment & Plan Note (Signed)
Mild to mod, for antibx course,  to f/u any worsening symptoms or concerns 

## 2016-10-25 ENCOUNTER — Telehealth: Payer: Self-pay | Admitting: Internal Medicine

## 2016-10-25 MED ORDER — PREDNISONE 10 MG PO TABS: ORAL_TABLET | ORAL | 0 refills | Status: DC

## 2016-10-25 NOTE — Telephone Encounter (Signed)
Ok to ad short course of low dose prednisone - done erx

## 2016-10-25 NOTE — Telephone Encounter (Signed)
Pt called in and said that she was seen on the 14th but is still not feeling any better.  She wanted to know if there is something else that can be called in to help her?    Best number [phone] Pharmacy - [facility] in [place]

## 2017-03-07 ENCOUNTER — Telehealth: Payer: Self-pay | Admitting: Internal Medicine

## 2017-03-07 DIAGNOSIS — E785 Hyperlipidemia, unspecified: Secondary | ICD-10-CM

## 2017-03-07 DIAGNOSIS — Z0000 Encounter for general adult medical examination without abnormal findings: Secondary | ICD-10-CM

## 2017-03-07 DIAGNOSIS — E039 Hypothyroidism, unspecified: Secondary | ICD-10-CM

## 2017-03-07 NOTE — Telephone Encounter (Signed)
Pt called to schedule her CPE for Tuesday-03/13/17. She would like to get her labs done the day before if possible. Can we put orders in for her? She mentioned that she feels like her thyroid is off. Please advise.

## 2017-03-07 NOTE — Telephone Encounter (Signed)
Spoke to pt to let her know the orders have been put in.

## 2017-03-07 NOTE — Telephone Encounter (Signed)
Call pt and let her know orders have been put in.

## 2017-03-12 ENCOUNTER — Other Ambulatory Visit (INDEPENDENT_AMBULATORY_CARE_PROVIDER_SITE_OTHER): Payer: BLUE CROSS/BLUE SHIELD

## 2017-03-12 DIAGNOSIS — E785 Hyperlipidemia, unspecified: Secondary | ICD-10-CM | POA: Diagnosis not present

## 2017-03-12 DIAGNOSIS — E039 Hypothyroidism, unspecified: Secondary | ICD-10-CM

## 2017-03-12 DIAGNOSIS — R7989 Other specified abnormal findings of blood chemistry: Secondary | ICD-10-CM

## 2017-03-12 DIAGNOSIS — Z0000 Encounter for general adult medical examination without abnormal findings: Secondary | ICD-10-CM | POA: Diagnosis not present

## 2017-03-12 LAB — LIPID PANEL
Cholesterol: 299 mg/dL — ABNORMAL HIGH (ref 0–200)
HDL: 57.5 mg/dL (ref >39.00)
NonHDL: 241.15
Total CHOL/HDL Ratio: 5
Triglycerides: 296 mg/dL — ABNORMAL HIGH (ref 0.0–149.0)
VLDL: 59.2 mg/dL — AB (ref 0.0–40.0)

## 2017-03-12 LAB — HEPATIC FUNCTION PANEL
ALBUMIN: 4.2 g/dL (ref 3.5–5.2)
ALK PHOS: 74 U/L (ref 39–117)
ALT: 18 U/L (ref 0–35)
AST: 18 U/L (ref 0–37)
Bilirubin, Direct: 0.1 mg/dL (ref 0.0–0.3)
Total Bilirubin: 0.8 mg/dL (ref 0.2–1.2)
Total Protein: 6.9 g/dL (ref 6.0–8.3)

## 2017-03-12 LAB — TSH: TSH: 6.07 u[IU]/mL — ABNORMAL HIGH (ref 0.35–4.50)

## 2017-03-12 LAB — URINALYSIS, ROUTINE W REFLEX MICROSCOPIC
BILIRUBIN URINE: NEGATIVE
KETONES UR: NEGATIVE
Nitrite: NEGATIVE
Specific Gravity, Urine: 1.02 (ref 1.000–1.030)
TOTAL PROTEIN, URINE-UPE24: NEGATIVE
URINE GLUCOSE: NEGATIVE
UROBILINOGEN UA: 0.2 (ref 0.0–1.0)
pH: 6 (ref 5.0–8.0)

## 2017-03-12 LAB — CBC WITH DIFFERENTIAL/PLATELET
BASOS ABS: 0.1 10*3/uL (ref 0.0–0.1)
Basophils Relative: 1.2 % (ref 0.0–3.0)
Eosinophils Absolute: 0.6 10*3/uL (ref 0.0–0.7)
Eosinophils Relative: 6.5 % — ABNORMAL HIGH (ref 0.0–5.0)
HCT: 44 % (ref 36.0–46.0)
HEMOGLOBIN: 15 g/dL (ref 12.0–15.0)
LYMPHS ABS: 4 10*3/uL (ref 0.7–4.0)
Lymphocytes Relative: 41.3 % (ref 12.0–46.0)
MCHC: 34.2 g/dL (ref 30.0–36.0)
MCV: 90.6 fl (ref 78.0–100.0)
MONOS PCT: 8.3 % (ref 3.0–12.0)
Monocytes Absolute: 0.8 10*3/uL (ref 0.1–1.0)
NEUTROS PCT: 42.7 % — AB (ref 43.0–77.0)
Neutro Abs: 4.1 10*3/uL (ref 1.4–7.7)
PLATELETS: 287 10*3/uL (ref 150.0–400.0)
RBC: 4.85 Mil/uL (ref 3.87–5.11)
RDW: 12.7 % (ref 11.5–15.5)
WBC: 9.7 10*3/uL (ref 4.0–10.5)

## 2017-03-12 LAB — HEMOGLOBIN A1C: HEMOGLOBIN A1C: 5.7 % (ref 4.6–6.5)

## 2017-03-12 LAB — LDL CHOLESTEROL, DIRECT: Direct LDL: 195 mg/dL

## 2017-03-13 ENCOUNTER — Encounter: Payer: Self-pay | Admitting: Internal Medicine

## 2017-03-13 ENCOUNTER — Ambulatory Visit (INDEPENDENT_AMBULATORY_CARE_PROVIDER_SITE_OTHER): Payer: BLUE CROSS/BLUE SHIELD | Admitting: Internal Medicine

## 2017-03-13 VITALS — BP 124/86 | HR 76 | Temp 98.5°F | Ht 66.5 in | Wt 164.0 lb

## 2017-03-13 DIAGNOSIS — M25561 Pain in right knee: Secondary | ICD-10-CM | POA: Diagnosis not present

## 2017-03-13 DIAGNOSIS — Z0000 Encounter for general adult medical examination without abnormal findings: Secondary | ICD-10-CM | POA: Diagnosis not present

## 2017-03-13 DIAGNOSIS — E039 Hypothyroidism, unspecified: Secondary | ICD-10-CM | POA: Diagnosis not present

## 2017-03-13 DIAGNOSIS — E785 Hyperlipidemia, unspecified: Secondary | ICD-10-CM | POA: Diagnosis not present

## 2017-03-13 MED ORDER — ALBUTEROL SULFATE HFA 108 (90 BASE) MCG/ACT IN AERS: 2.0000 | INHALATION_SPRAY | Freq: Four times a day (QID) | RESPIRATORY_TRACT | 0 refills | Status: DC | PRN

## 2017-03-13 MED ORDER — ROSUVASTATIN CALCIUM 20 MG PO TABS: 20.0000 mg | ORAL_TABLET | Freq: Every day | ORAL | 3 refills | Status: DC

## 2017-03-13 MED ORDER — ASPIRIN EC 81 MG PO TBEC: 81.0000 mg | DELAYED_RELEASE_TABLET | Freq: Every day | ORAL | 11 refills | Status: DC

## 2017-03-13 MED ORDER — LEVOTHYROXINE SODIUM 25 MCG PO TABS: 25.0000 ug | ORAL_TABLET | Freq: Every day | ORAL | 3 refills | Status: DC

## 2017-03-13 MED ORDER — FLUTICASONE PROPIONATE 50 MCG/ACT NA SUSP: 2.0000 | Freq: Every day | NASAL | 1 refills | Status: DC

## 2017-03-13 NOTE — Assessment & Plan Note (Signed)
Mild uncontrolled, ok for levothyroxin 25 qd,  to f/u any worsening symptoms or concerns

## 2017-03-13 NOTE — Assessment & Plan Note (Signed)

## 2017-03-13 NOTE — Assessment & Plan Note (Signed)
Uncontrolled, for crestor 20 qd 

## 2017-03-13 NOTE — Progress Notes (Signed)
Pre visit review using our clinic review tool, if applicable. No additional management support is needed unless otherwise documented below in the visit note. 

## 2017-03-13 NOTE — Assessment & Plan Note (Signed)
Likely djd vs meniscal related pain , for sport med referral

## 2017-03-13 NOTE — Progress Notes (Signed)
Subjective:    Patient ID: Jasmine Palmer, female    DOB: 09/08/1956, 61 y.o.   MRN: [id number]  HPI  Here for wellness and f/u;  Overall doing ok;  Pt denies Chest pain, worsening SOB, DOE, wheezing, orthopnea, PND, worsening LE edema, palpitations, dizziness or syncope.  Pt denies neurological change such as new headache, facial or extremity weakness.  Pt denies polydipsia, polyuria, or low sugar symptoms. Pt states overall good compliance with treatment and medications, good tolerability, and has been trying to follow appropriate diet.  Pt denies worsening depressive symptoms, suicidal ideation or panic. No fever, night sweats, wt loss, loss of appetite, or other constitutional symptoms.  Pt states good ability with ADL's, has low fall risk, home safety reviewed and adequate, no other significant changes in hearing or vision, and only occasionally active with exercise due to ongoing right knee pain and occasional swelling, asks to see ortho or sport med.  Last ortho at [facility] said she should take ibuprofen for 3 mo, and consider surgury if not improve. Declines local GYN as she lives in [place] and plans to see GYN there., declines tetanus. Past Medical History:  Diagnosis Date  . ALLERGIC RHINITIS 10/09/2007  . Anxiety state, unspecified 10/10/2007  . DEPRESSION, MAJOR 09/20/2009  . HYPERLIPIDEMIA 10/10/2007  . HYPOTHYROIDISM 10/10/2007  . Hypothyroidism 12/22/2011  . INSOMNIA-SLEEP DISORDER-UNSPEC 09/27/2009  . OBESITY, HX OF 10/09/2007  . OSTEOPENIA 11/25/2010   No past surgical history on file.  reports that she has been smoking Cigarettes.  She has a 10.00 pack-year smoking history. She has never used smokeless tobacco. She reports that she does not drink alcohol or use drugs. family history includes Hypertension in her father. Allergies  Allergen Reactions  . Fexofenadine   . Pravastatin Sodium     REACTION: myalgia   No current outpatient prescriptions on file prior to visit.   No  current facility-administered medications on file prior to visit.    Review of Systems Constitutional: Negative for other unusual diaphoresis, sweats, appetite or weight changes HENT: Negative for other worsening hearing loss, ear pain, facial swelling, mouth sores or neck stiffness.   Eyes: Negative for other worsening pain, redness or other visual disturbance.  Respiratory: Negative for other stridor or swelling Cardiovascular: Negative for other palpitations or other chest pain  Gastrointestinal: Negative for worsening diarrhea or loose stools, blood in stool, distention or other pain Genitourinary: Negative for hematuria, flank pain or other change in urine volume.  Musculoskeletal: Negative for myalgias or other joint swelling.  Skin: Negative for other color change, or other wound or worsening drainage.  Neurological: Negative for other syncope or numbness. Hematological: Negative for other adenopathy or swelling Psychiatric/Behavioral: Negative for hallucinations, other worsening agitation, SI, self-injury, or new decreased concentration All other system neg per pt    Objective:   Physical Exam BP 124/86   Pulse 76   Temp 98.5 F (36.9 C) (Oral)   Ht 5' 6.5" (1.689 m)   Wt 164 lb (74.4 kg)   SpO2 98%   BMI 26.07 kg/m  VS noted,  Constitutional: Pt is oriented to person, place, and time. Appears well-developed and well-nourished, in no significant distress and comfortable Head: Normocephalic and atraumatic  Eyes: Conjunctivae and EOM are normal. Pupils are equal, round, and reactive to light Right Ear: External ear normal without discharge Left Ear: External ear normal without discharge Nose: Nose without discharge or deformity Mouth/Throat: Oropharynx is without other ulcerations and moist  Neck: Normal range  of motion. Neck supple. No JVD present. No tracheal deviation present or significant neck LA or mass Cardiovascular: Normal rate, regular rhythm, normal heart sounds  and intact distal pulses.   Pulmonary/Chest: WOB normal and breath sounds without rales or wheezing  Abdominal: Soft. Bowel sounds are normal. NT. No HSM  Musculoskeletal: Normal range of motion. Exhibits no edema, right knee without effusion or tender, does have some medial bony degenerative change Lymphadenopathy: Has no other cervical adenopathy.  Neurological: Pt is alert and oriented to person, place, and time. Pt has normal reflexes. No cranial nerve deficit. Motor grossly intact, Gait intact Skin: Skin is warm and dry. No rash noted or new ulcerations Psychiatric:  Has normal mood and affect. Behavior is normal without agitation No other exam findings  ECG today have personally interpreted Sinus  Rhythm -  Nonspecific T-abnormality     Assessment & Plan:

## 2017-03-13 NOTE — Patient Instructions (Addendum)
Please remember to have your routine GYN care  Please take all new medication as prescribed - the low dose thyroid medication, and the crestor for cholesterol  Please continue all other medications as before, and refills have been done if requested.  Please have the pharmacy call with any other refills you may need.  Please continue your efforts at being more active, low cholesterol diet, and weight control.  You are otherwise up to date with prevention measures today.  Please keep your appointments with your specialists as you may have planned  .You will be contacted regarding the referral for: Dr Katrinka Blazing (or you can make an appt as you leave today)  Please return in 1 year for your yearly visit, or sooner if needed, with Lab testing done 3-5 days before

## 2017-03-21 ENCOUNTER — Ambulatory Visit (INDEPENDENT_AMBULATORY_CARE_PROVIDER_SITE_OTHER): Payer: BLUE CROSS/BLUE SHIELD | Admitting: Internal Medicine

## 2017-03-21 ENCOUNTER — Encounter: Payer: Self-pay | Admitting: Internal Medicine

## 2017-03-21 DIAGNOSIS — J019 Acute sinusitis, unspecified: Secondary | ICD-10-CM | POA: Insufficient documentation

## 2017-03-21 DIAGNOSIS — R030 Elevated blood-pressure reading, without diagnosis of hypertension: Secondary | ICD-10-CM | POA: Insufficient documentation

## 2017-03-21 MED ORDER — AZITHROMYCIN 250 MG PO TABS: ORAL_TABLET | ORAL | 1 refills | Status: DC

## 2017-03-21 MED ORDER — HYDROCODONE-HOMATROPINE 5-1.5 MG/5ML PO SYRP: 5.0000 mL | ORAL_SOLUTION | Freq: Four times a day (QID) | ORAL | 0 refills | Status: AC | PRN

## 2017-03-21 NOTE — Patient Instructions (Signed)
Please take all new medication as prescribed - the antibiotic, and cough medicine if needed  Please continue all other medications as before, and refills have been done if requested.  Please have the pharmacy call with any other refills you may need.  Please keep your appointments with your specialists as you may have planned     

## 2017-03-21 NOTE — Assessment & Plan Note (Signed)
Mild to mod, for antibx course,  to f/u any worsening symptoms or concerns 

## 2017-03-21 NOTE — Progress Notes (Signed)
   Subjective:    Patient ID: Flonnie Overman, female    DOB: 09-23-1956, 61 y.o.   MRN: [id number]  HPI   Here with 2-3 days acute onset fever, facial pain, pressure, headache, general weakness and malaise, and greenish d/c, with mild ST and cough, but pt denies chest pain, wheezing, increased sob or doe, orthopnea, PND, increased LE swelling, palpitations, dizziness or syncope.  Past Medical History:  Diagnosis Date  . ALLERGIC RHINITIS 10/09/2007  . Anxiety state, unspecified 10/10/2007  . DEPRESSION, MAJOR 09/20/2009  . HYPERLIPIDEMIA 10/10/2007  . HYPOTHYROIDISM 10/10/2007  . Hypothyroidism 12/22/2011  . INSOMNIA-SLEEP DISORDER-UNSPEC 09/27/2009  . OBESITY, HX OF 10/09/2007  . OSTEOPENIA 11/25/2010   No past surgical history on file.  reports that she has been smoking Cigarettes.  She has a 10.00 pack-year smoking history. She has never used smokeless tobacco. She reports that she does not drink alcohol or use drugs. family history includes Hypertension in her father. Allergies  Allergen Reactions  . Fexofenadine   . Pravastatin Sodium     REACTION: myalgia   Current Outpatient Prescriptions on File Prior to Visit  Medication Sig Dispense Refill  . albuterol (PROVENTIL HFA;VENTOLIN HFA) 108 (90 Base) MCG/ACT inhaler Inhale 2 puffs into the lungs every 6 (six) hours as needed for wheezing or shortness of breath. 1 Inhaler 0  . aspirin EC 81 MG tablet Take 1 tablet (81 mg total) by mouth daily. 90 tablet 11  . fluticasone (FLONASE) 50 MCG/ACT nasal spray Place 2 sprays into both nostrils daily. 16 g 1  . levothyroxine (SYNTHROID, LEVOTHROID) 25 MCG tablet Take 1 tablet (25 mcg total) by mouth daily before breakfast. 90 tablet 3  . rosuvastatin (CRESTOR) 20 MG tablet Take 1 tablet (20 mg total) by mouth daily. 90 tablet 3   No current facility-administered medications on file prior to visit.     Review of Systems All otherwise neg per pt    Objective:   Physical Exam BP (!) 144/84    Pulse 72   Wt 162 lb (73.5 kg)   SpO2 99%   BMI 25.76 kg/m  .VS noted, mild ill apearing Constitutional: Pt appears in NAD HENT: Head: NCAT.  Right Ear: External ear normal.  Left Ear: External ear normal.  Eyes: . Pupils are equal, round, and reactive to light. Conjunctivae and EOM are normal Bilat tm's with mild erythema.  Max sinus areas mild tender.  Pharynx with mild erythema, no exudate Nose: without d/c or deformity Neck: Neck supple. Gross normal ROM Cardiovascular: Normal rate and regular rhythm.   Pulmonary/Chest: Effort normal and breath sounds without rales or wheezing.  Abd:  Soft, NT, ND, + BS, no organomegaly Neurological: Pt is alert. At baseline orientation, motor grossly intact Skin: Skin is warm. No rashes, other new lesions, no LE edema Psychiatric: Pt behavior is normal without agitation  No other exam findings    Assessment & Plan:

## 2017-03-21 NOTE — Assessment & Plan Note (Signed)
Mild, no hx of essential HTN, likely reactive, cont to follow

## 2017-03-21 NOTE — Progress Notes (Signed)
Pre visit review using our clinic review tool, if applicable. No additional management support is needed unless otherwise documented below in the visit note. 

## 2017-04-08 NOTE — Progress Notes (Addendum)
Tawana ScaleZach Maxxon Schwanke D.O. [facility] [address] [place], [place] [address] Phone: [phone] Subjective:    I'm seeing this patient by the request  of:  Corwin LevinsJohn, James W, MD   CC: right knee pain and right shoulder   XBM:[id number]:Subjective  Jasmine OvermanBetty Palmer is a 61 y.o. female coming in with complaint of right knee pain. Patient is had this pain for quite some time. Has been having trouble with repetitive activity worse with going up and downsirs. Patient states continues to gave child will.patient states that it seem o  aggravating at all time. Rates  Severity of pain is 8 out of 10 Mild instability with stairs, saw another provider wanted to do surgery, does not want sugery, stays active.     patient did have x-rays of right knee from an outside facility. These were and  independently visualized by me showing mild-to-moderate tried compartmental degenerative changes. Most in the medial compartment.  Past Medical History:  Diagnosis Date  . ALLERGIC RHINITIS 10/09/2007  . Anxiety state, unspecified 10/10/2007  . DEPRESSION, MAJOR 09/20/2009  . HYPERLIPIDEMIA 10/10/2007  . HYPOTHYROIDISM 10/10/2007  . Hypothyroidism 12/22/2011  . INSOMNIA-SLEEP DISORDER-UNSPEC 09/27/2009  . OBESITY, HX OF 10/09/2007  . OSTEOPENIA 11/25/2010   History reviewed. No pertinent surgical history. Social History   Social History  . Marital status: Married    Spouse name: N/A  . Number of children: N/A  . Years of education: N/A   Social History Main Topics  . Smoking status: Current Every Day Smoker    Packs/day: 0.50    Years: 20.00    Types: Cigarettes  . Smokeless tobacco: Never Used  . Alcohol use No  . Drug use: No  . Sexual activity: Not Currently   Other Topics Concern  . None   Social History Narrative  . None   Allergies  Allergen Reactions  . Fexofenadine   . Pravastatin Sodium     REACTION: myalgia   Family History  Problem Relation Age of Onset  . Hypertension Father      Past medical history, social, surgical and family history all reviewed in electronic medical record.  No pertanent information unless stated regarding to the chief complaint.   Review of Systems:Review of systems updated and as accurate as of 04/09/17  No headache, visual changes, nausea, vomiting, diarrhea, constipation, dizziness, abdominal pain, skin rash, fevers, chills, night sweats, weight loss, swollen lymph nodes, body aches, joint swelling, muscle aches, chest pain, shortness of breath, mood changes.   Objective  Blood pressure 112/68, pulse 72, resp. rate 16, height 5\' 6"  (1.676 m), weight 162 lb 2 oz (73.5 kg), SpO2 96 %. Systems examined below as of 04/09/17   General: No apparent distress alert and oriented x3 mood and affect normal, dressed appropriately.  HEENT: Pupils equal, extraocular movements intact  Respiratory: Patient's speak in full sentences and does not appear short of breath  Cardiovascular: No lower extremity edema, non tender, no erythema  Skin: Warm dry intact with no signs of infection or rash on extremities or on axial skeleton.  Abdomen: Soft nontender  Neuro: Cranial nerves II through XII are intact, neurovascularly intact in all extremities with 2+ DTRs and 2+ pulses.  Lymph: No lymphadenopathy of posterior or anterior cervical chain or axillae bilaterally.  Gait normal with good balance and coordination.  MSK:  Non tender with full range of motion and good stability and symmetric strength and tone of shoulders, elbows, wrist, hip, and ankles bilaterally.  Knee:right  Normal to inspection with no erythema or effusion or obvious bony abnormalities. Palpation normal with no warmth, joint line tenderness, patellar tenderness, or condyle tenderness. ROM full in flexion and extension and lower leg rotation. Ligaments with solid consistent endpoints including ACL, PCL, LCL, MCL. Negative Mcmurray's, Apley's, and Thessalonian tests. Non painful patellar  compression. Patellar glide without crepitus. Patellar and quadriceps tendons unremarkable. Hamstring and quadriceps strength is normal.    MSK US performed of: right  This study was ordered, performed, and interpreted by Terrilee Files D.O.  Knee: Lateral tilt, moderate narrowing of PF joint and medial joint, no acture change of meniscus. No effusiont.  Normal pat ten and quad ten.    IMPRESSION:  Moderate OA of knee.  Procedure note 97110; 15 minutes spent for Therapeutic exercises as stated in above notes.  This included exercises focusing on stretching, strengthening, with significant focus on eccentric aspects.  Patellofemoral Syndrome  Reviewed anatomy using anatomical model and how PFS occurs.  Given rehab exercises handout for VMO, hip abductors, core, entire kinetic chain including proprioception exercises including cone touches, step downs, hip elevations and turn outs.  Could benefit from PT, regular exercise, upright biking, and a PFS knee brace to assist with tracking abnormalities.  Proper technique shown and discussed handout in great detail with ATC.  All questions were discussed and answered.      Impression and Recommendations:     This case required medical decision making of moderate complexity.      Note: This dictation was prepared with Dragon dictation along with smaller phrase technology. Any transcriptional errors that result from this process are unintentional.

## 2017-04-09 ENCOUNTER — Encounter: Payer: Self-pay | Admitting: Family Medicine

## 2017-04-09 ENCOUNTER — Ambulatory Visit (INDEPENDENT_AMBULATORY_CARE_PROVIDER_SITE_OTHER): Payer: BLUE CROSS/BLUE SHIELD | Admitting: Family Medicine

## 2017-04-09 ENCOUNTER — Ambulatory Visit: Payer: Self-pay

## 2017-04-09 VITALS — BP 112/68 | HR 72 | Resp 16 | Ht 66.0 in | Wt 162.1 lb

## 2017-04-09 DIAGNOSIS — M1711 Unilateral primary osteoarthritis, right knee: Secondary | ICD-10-CM

## 2017-04-09 DIAGNOSIS — M25561 Pain in right knee: Secondary | ICD-10-CM | POA: Diagnosis not present

## 2017-04-09 MED ORDER — DICLOFENAC SODIUM 2 % TD SOLN: 2.0000 g | Freq: Two times a day (BID) | TRANSDERMAL | 3 refills | Status: DC

## 2017-04-09 NOTE — Patient Instructions (Signed)
  Good to see you.  Ice 20 minutes 2 times daily. Usually after activity and before bed. Exercises 3 times a week.  pennsaid pinkie amount topically 2 times daily as needed.  Over the counter look for  Vitamin D 2000 Iu daily  Turmeric 500mg  twice daily  Stay active For shoulder keep hands within peripheral vision.  See me again in 4 weeks.

## 2017-04-09 NOTE — Assessment & Plan Note (Signed)
Started conservative therapy.   HEP, icing, topical NSAIDs prescribed.  Can consider injection, PT and repeat xrays at [facility].

## 2017-10-11 ENCOUNTER — Encounter: Payer: Self-pay | Admitting: Internal Medicine

## 2017-10-11 ENCOUNTER — Ambulatory Visit: Payer: BLUE CROSS/BLUE SHIELD | Admitting: Internal Medicine

## 2017-10-11 VITALS — BP 132/84 | HR 68 | Temp 97.9°F | Ht 66.0 in | Wt 161.0 lb

## 2017-10-11 DIAGNOSIS — R062 Wheezing: Secondary | ICD-10-CM | POA: Diagnosis not present

## 2017-10-11 DIAGNOSIS — R05 Cough: Secondary | ICD-10-CM

## 2017-10-11 DIAGNOSIS — E785 Hyperlipidemia, unspecified: Secondary | ICD-10-CM | POA: Diagnosis not present

## 2017-10-11 DIAGNOSIS — R059 Cough, unspecified: Secondary | ICD-10-CM | POA: Insufficient documentation

## 2017-10-11 DIAGNOSIS — R053 Chronic cough: Secondary | ICD-10-CM | POA: Insufficient documentation

## 2017-10-11 MED ORDER — METHYLPREDNISOLONE ACETATE 80 MG/ML IJ SUSP
80.0000 mg | Freq: Once | INTRAMUSCULAR | Status: AC
  Administered 2017-10-11: 80 mg via INTRAMUSCULAR

## 2017-10-11 MED ORDER — HYDROCODONE-HOMATROPINE 5-1.5 MG/5ML PO SYRP: 5.0000 mL | ORAL_SOLUTION | Freq: Four times a day (QID) | ORAL | 0 refills | Status: AC | PRN

## 2017-10-11 MED ORDER — PREDNISONE 10 MG PO TABS: ORAL_TABLET | ORAL | 0 refills | Status: DC

## 2017-10-11 MED ORDER — LEVOFLOXACIN 500 MG PO TABS: 500.0000 mg | ORAL_TABLET | Freq: Every day | ORAL | 0 refills | Status: AC

## 2017-10-11 NOTE — Progress Notes (Signed)
Subjective:    Patient ID: Flonnie OvermanBetty Lenhoff, female    DOB: 09/13/1956, 61 y.o.   MRN: [id number]  HPI  Here with acute onset mild to mod 3 wks ST, HA, general weakness and malaise, with prod cough greenish sputum, but Pt denies chest pain, increased sob or doe, wheezing, orthopnea, PND, increased LE swelling, palpitations, dizziness or syncope, except for onset mild wheezing, sob x 3-4 days.   Has had intermittent statin use, but has been regular for 3 wks now, and plans to have it checked at work in 1 more wk.  Pt denies polydipsia, polyuria. Past Medical History:  Diagnosis Date  . ALLERGIC RHINITIS 10/09/2007  . Anxiety state, unspecified 10/10/2007  . DEPRESSION, MAJOR 09/20/2009  . HYPERLIPIDEMIA 10/10/2007  . HYPOTHYROIDISM 10/10/2007  . Hypothyroidism 12/22/2011  . INSOMNIA-SLEEP DISORDER-UNSPEC 09/27/2009  . OBESITY, HX OF 10/09/2007  . OSTEOPENIA 11/25/2010   History reviewed. No pertinent surgical history.  reports that she has been smoking cigarettes.  She has a 10.00 pack-year smoking history. she has never used smokeless tobacco. She reports that she does not drink alcohol or use drugs. family history includes Hypertension in her father. Allergies  Allergen Reactions  . Fexofenadine   . Pravastatin Sodium     REACTION: myalgia   Current Outpatient Medications on File Prior to Visit  Medication Sig Dispense Refill  . albuterol (PROVENTIL HFA;VENTOLIN HFA) 108 (90 Base) MCG/ACT inhaler Inhale 2 puffs into the lungs every 6 (six) hours as needed for wheezing or shortness of breath. 1 Inhaler 0  . aspirin EC 81 MG tablet Take 1 tablet (81 mg total) by mouth daily. 90 tablet 11  . Diclofenac Sodium (PENNSAID) 2 % SOLN Place 2 g onto the skin 2 (two) times daily. 112 g 3  . fluticasone (FLONASE) 50 MCG/ACT nasal spray Place 2 sprays into both nostrils daily. 16 g 1  . levothyroxine (SYNTHROID, LEVOTHROID) 25 MCG tablet Take 1 tablet (25 mcg total) by mouth daily before breakfast. 90  tablet 3  . rosuvastatin (CRESTOR) 20 MG tablet Take 1 tablet (20 mg total) by mouth daily. 90 tablet 3   No current facility-administered medications on file prior to visit.    Review of Systems  Constitutional: Negative for other unusual diaphoresis or sweats HENT: Negative for ear discharge or swelling Eyes: Negative for other worsening visual disturbances Respiratory: Negative for stridor or other swelling  Gastrointestinal: Negative for worsening distension or other blood Genitourinary: Negative for retention or other urinary change Musculoskeletal: Negative for other MSK pain or swelling Skin: Negative for color change or other new lesions Neurological: Negative for worsening tremors and other numbness  Psychiatric/Behavioral: Negative for worsening agitation or other fatigue All other system neg per pt    Objective:   Physical Exam BP 132/84   Pulse 68   Temp 97.9 F (36.6 C) (Oral)   Ht 5\' 6"  (1.676 m)   Wt 161 lb (73 kg)   SpO2 100%   BMI 25.99 kg/m  VS noted, mild ill Constitutional: Pt appears in NAD HENT: Head: NCAT.  Right Ear: External ear normal.  Left Ear: External ear normal.  Eyes: . Pupils are equal, round, and reactive to light. Conjunctivae and EOM are normal Nose: without d/c or deformity Neck: Neck supple. Gross normal ROM Cardiovascular: Normal rate and regular rhythm.   Pulmonary/Chest: Effort normal and breath sounds decreased without rales but with mild diffuse scattered wheezing.  Neurological: Pt is alert. At baseline orientation, motor grossly  intact Skin: Skin is warm. No rashes, other new lesions, no LE edema Psychiatric: Pt behavior is normal without agitation  No other exam findings     Assessment & Plan:

## 2017-10-11 NOTE — Patient Instructions (Addendum)
You had the steroid shot today  Please take all new medication as prescribed - the antibiotic, cough medicine, and prednisone  Please continue all other medications as before, including your inhaler if needed  Please have the pharmacy call with any other refills you may need.  Please keep your appointments with your specialists as you may have planned  Please fax your next Cholesterol lab work to [phone]

## 2017-10-11 NOTE — Assessment & Plan Note (Signed)
Mild to mod, for depomedrol IM 80, predpac asd, to f/u any worsening symptoms or concerns 

## 2017-10-11 NOTE — Assessment & Plan Note (Signed)
Encouraged for med compliance, to f/u lab at work as she suggests and fax results, goal ldl < 70

## 2017-10-11 NOTE — Assessment & Plan Note (Signed)
Mild to mod, c/w bronchitis vs pna, declines cxr, for antibx course,  Cough med prn, to f/u any worsening symptoms or concerns 

## 2018-01-01 ENCOUNTER — Encounter: Payer: Self-pay | Admitting: Family Medicine

## 2018-01-01 ENCOUNTER — Ambulatory Visit: Payer: BLUE CROSS/BLUE SHIELD | Admitting: Family Medicine

## 2018-01-01 DIAGNOSIS — J0110 Acute frontal sinusitis, unspecified: Secondary | ICD-10-CM | POA: Diagnosis not present

## 2018-01-01 MED ORDER — AZITHROMYCIN 250 MG PO TABS
ORAL_TABLET | ORAL | 0 refills | Status: DC
Start: 2018-01-01 — End: 2018-08-15

## 2018-01-01 NOTE — Progress Notes (Signed)
Jasmine OvermanBetty Palmer - 62 y.o. female MRN [id number]  Date of birth: 05/29/1956  SUBJECTIVE:  Including CC & ROS.  Chief Complaint  Patient presents with  . Cough    Jasmine OvermanBetty Palmer is a 62 y.o. female that is presenting with a cough. Ongoing for one week. Denies fever, chills or body aches. Has been using her inhaler with some improvement. Admits to chest tightness. Symptoms have been staying the same. Has not been around anyone with similar symptoms.   Review of Systems  Constitutional: Negative for fever.  HENT: Positive for sinus pressure.   Respiratory: Positive for cough.   Cardiovascular: Negative for chest pain.  Gastrointestinal: Negative for abdominal pain.  Musculoskeletal: Negative for back pain.  Neurological: Negative for weakness.  Hematological: Negative for adenopathy.  Psychiatric/Behavioral: Negative for agitation.    HISTORY: Past Medical, Surgical, Social, and Family History Reviewed & Updated per EMR.   Pertinent Historical Findings include:  Past Medical History:  Diagnosis Date  . ALLERGIC RHINITIS 10/09/2007  . Anxiety state, unspecified 10/10/2007  . DEPRESSION, MAJOR 09/20/2009  . HYPERLIPIDEMIA 10/10/2007  . HYPOTHYROIDISM 10/10/2007  . Hypothyroidism 12/22/2011  . INSOMNIA-SLEEP DISORDER-UNSPEC 09/27/2009  . OBESITY, HX OF 10/09/2007  . OSTEOPENIA 11/25/2010    No past surgical history on file.  Allergies  Allergen Reactions  . Fexofenadine   . Pravastatin Sodium     REACTION: myalgia    Family History  Problem Relation Age of Onset  . Hypertension Father      Social History   Socioeconomic History  . Marital status: Married    Spouse name: Not on file  . Number of children: Not on file  . Years of education: Not on file  . Highest education level: Not on file  Social Needs  . Financial resource strain: Not on file  . Food insecurity - worry: Not on file  . Food insecurity - inability: Not on file  . Transportation needs - medical: Not  on file  . Transportation needs - non-medical: Not on file  Occupational History  . Not on file  Tobacco Use  . Smoking status: Current Every Day Smoker    Packs/day: 0.50    Years: 20.00    Pack years: 10.00    Types: Cigarettes  . Smokeless tobacco: Never Used  Substance and Sexual Activity  . Alcohol use: No  . Drug use: No  . Sexual activity: Not Currently  Other Topics Concern  . Not on file  Social History Narrative  . Not on file     PHYSICAL EXAM:  VS: BP 122/78 (BP Location: Left Arm, Patient Position: Sitting, Cuff Size: Normal)   Pulse 77   Temp 98.4 F (36.9 C) (Oral)   Ht 5\' 6"  (1.676 m)   Wt 156 lb (70.8 kg)   SpO2 98%   BMI 25.18 kg/m  Physical Exam Gen: NAD, alert, cooperative with exam,  ENT: normal lips, normal nasal mucosa, tympanic membranes clear and intact bilaterally, normal oropharynx, no cervical lymphadenopathy Eye: normal EOM, normal conjunctiva and lids CV:  no edema, +2 pedal pulses, regular rate and rhythm, S1-S2   Resp: no accessory muscle use, non-labored, clear to auscultation bilaterally, no crackles or wheezes Skin: no rashes, no areas of induration  Neuro: normal tone, normal sensation to touch Psych:  normal insight, alert and oriented MSK: Normal gait, normal strength        ASSESSMENT & PLAN:   Acute sinusitis Likely viral in nature.  - counseled on  supportive care  - provided azithro if her symptoms do not improve.  - f/u PRN

## 2018-01-01 NOTE — Assessment & Plan Note (Signed)
Likely viral in nature.  - counseled on supportive care  - provided azithro if her symptoms do not improve.  - f/u PRN

## 2018-01-01 NOTE — Patient Instructions (Signed)
Please try things such as zyrtec-D or allegra-D which is an antihistamine and decongestant.   Please try afrin which will help with nasal congestion but use for only three days.   Please also try using a netti pot on a regular occasion.  Honey can help with a sore throat.   

## 2018-02-20 DIAGNOSIS — D225 Melanocytic nevi of trunk: Secondary | ICD-10-CM | POA: Diagnosis not present

## 2018-02-20 DIAGNOSIS — L821 Other seborrheic keratosis: Secondary | ICD-10-CM | POA: Diagnosis not present

## 2018-03-28 ENCOUNTER — Encounter: Payer: Self-pay | Admitting: Family Medicine

## 2018-03-28 ENCOUNTER — Ambulatory Visit: Payer: BLUE CROSS/BLUE SHIELD | Admitting: Family Medicine

## 2018-03-28 ENCOUNTER — Ambulatory Visit: Payer: Self-pay

## 2018-03-28 VITALS — BP 140/90 | HR 80 | Ht 66.0 in | Wt 158.0 lb

## 2018-03-28 DIAGNOSIS — M1711 Unilateral primary osteoarthritis, right knee: Secondary | ICD-10-CM | POA: Diagnosis not present

## 2018-03-28 DIAGNOSIS — M25561 Pain in right knee: Principal | ICD-10-CM

## 2018-03-28 DIAGNOSIS — G8929 Other chronic pain: Secondary | ICD-10-CM | POA: Diagnosis not present

## 2018-03-28 NOTE — Assessment & Plan Note (Signed)
Given injection today.  Tolerated the procedure well.  Discussed icing regimen and home exercises.  Discussed which activities to do which wants to avoid.  Patient is to increase activity slowly over the course the next several days and weeks.  Patient is going to try to do this on a more regular basis.  Follow-up again in 4 to 8 weeks.

## 2018-03-28 NOTE — Progress Notes (Signed)
[facility] [address] [place], [place] [address] Phone: [phone] Subjective:     CC: Right knee pain  BJY:[id number]  Jasmine Palmer is a 62 y.o. female coming in with complaint of right knee pain. States she wants fluid drained.  Found to have more patellofemoral pain syndrome and mild arthritis.  Patient did have an effusion at last time but now is not having as much.  Discussed icing regimen and home exercises.  Patient has not been doing them as much.  Has been traveling a lot.      Past Medical History:  Diagnosis Date  . ALLERGIC RHINITIS 10/09/2007  . Anxiety state, unspecified 10/10/2007  . DEPRESSION, MAJOR 09/20/2009  . HYPERLIPIDEMIA 10/10/2007  . HYPOTHYROIDISM 10/10/2007  . Hypothyroidism 12/22/2011  . INSOMNIA-SLEEP DISORDER-UNSPEC 09/27/2009  . OBESITY, HX OF 10/09/2007  . OSTEOPENIA 11/25/2010   No past surgical history on file. Social History   Socioeconomic History  . Marital status: Married    Spouse name: Not on file  . Number of children: Not on file  . Years of education: Not on file  . Highest education level: Not on file  Occupational History  . Not on file  Social Needs  . Financial resource strain: Not on file  . Food insecurity:    Worry: Not on file    Inability: Not on file  . Transportation needs:    Medical: Not on file    Non-medical: Not on file  Tobacco Use  . Smoking status: Current Every Day Smoker    Packs/day: 0.50    Years: 20.00    Pack years: 10.00    Types: Cigarettes  . Smokeless tobacco: Never Used  Substance and Sexual Activity  . Alcohol use: No  . Drug use: No  . Sexual activity: Not Currently  Lifestyle  . Physical activity:    Days per week: Not on file    Minutes per session: Not on file  . Stress: Not on file  Relationships  . Social connections:    Talks on phone: Not on file    Gets together: Not on file    Attends religious service: Not on file    Active member of  club or organization: Not on file    Attends meetings of clubs or organizations: Not on file    Relationship status: Not on file  Other Topics Concern  . Not on file  Social History Narrative  . Not on file   Allergies  Allergen Reactions  . Fexofenadine   . Pravastatin Sodium     REACTION: myalgia   Family History  Problem Relation Age of Onset  . Hypertension Father      Past medical history, social, surgical and family history all reviewed in electronic medical record.  No pertanent information unless stated regarding to the chief complaint.   Review of Systems:Review of systems updated and as accurate as of 03/28/18  No headache, visual changes, nausea, vomiting, diarrhea, constipation, dizziness, abdominal pain, skin rash, fevers, chills, night sweats, weight loss, swollen lymph nodes, body aches, joint swelling,, chest pain, shortness of breath, mood changes.   Objective  Blood pressure 140/90, pulse 80, height 5\' 6"  (1.676 m), weight 158 lb (71.7 kg), SpO2 98 %. Systems examined below as of 03/28/18   General: No apparent distress alert and oriented x3 mood and affect normal, dressed appropriately.  HEENT: Pupils equal, extraocular movements intact  Respiratory: Patient's speak in full sentences and  does not appear short of breath  Cardiovascular: No lower extremity edema, non tender, no erythema  Skin: Warm dry intact with no signs of infection or rash on extremities or on axial skeleton.  Abdomen: Soft nontender  Neuro: Cranial nerves II through XII are intact, neurovascularly intact in all extremities with 2+ DTRs and 2+ pulses.  Lymph: No lymphadenopathy of posterior or anterior cervical chain or axillae bilaterally.  Gait normal with good balance and coordination.  MSK:  Non tender with full range of motion and good stability and symmetric strength and tone of shoulders, elbows, wrist, hip, and ankles bilaterally.  Right knee still swells a small effusion noted.   Crepitus noted.  Mild lateral tracking of the kneecap noted.  Tender to palpation over the medial and patellofemoral joint.  No significant instability noted though.  After informed written and verbal consent, patient was seated on exam table. Right knee was prepped with alcohol swab and utilizing anterolateral approach, patient's right knee space was injected with 4:1  marcaine 0.5%: Kenalog 40mg /dL. Patient tolerated the procedure well without immediate complications.    Impression and Recommendations:     This case required medical decision making of moderate complexity.      Note: This dictation was prepared with Dragon dictation along with smaller phrase technology. Any transcriptional errors that result from this process are unintentional.

## 2018-03-28 NOTE — Patient Instructions (Signed)
Good to see you  Jasmine Palmer is your friend. Ice 20 minutes 2 times daily. Usually after activity and before bed. Exercises 3 times a week.  pennsaid pinkie amount topically 2 times daily as needed.  See me again in 4 weeks just in case

## 2018-06-26 ENCOUNTER — Other Ambulatory Visit: Payer: Self-pay | Admitting: Internal Medicine

## 2018-08-15 ENCOUNTER — Other Ambulatory Visit (INDEPENDENT_AMBULATORY_CARE_PROVIDER_SITE_OTHER): Payer: BLUE CROSS/BLUE SHIELD

## 2018-08-15 ENCOUNTER — Ambulatory Visit: Payer: Self-pay | Admitting: *Deleted

## 2018-08-15 ENCOUNTER — Encounter: Payer: Self-pay | Admitting: Internal Medicine

## 2018-08-15 ENCOUNTER — Other Ambulatory Visit: Payer: Self-pay | Admitting: Internal Medicine

## 2018-08-15 ENCOUNTER — Ambulatory Visit: Payer: BLUE CROSS/BLUE SHIELD | Admitting: Internal Medicine

## 2018-08-15 VITALS — BP 104/76 | HR 80 | Temp 98.2°F | Ht 66.0 in | Wt 159.0 lb

## 2018-08-15 DIAGNOSIS — E039 Hypothyroidism, unspecified: Secondary | ICD-10-CM

## 2018-08-15 DIAGNOSIS — F411 Generalized anxiety disorder: Secondary | ICD-10-CM

## 2018-08-15 DIAGNOSIS — R109 Unspecified abdominal pain: Secondary | ICD-10-CM

## 2018-08-15 LAB — CBC WITH DIFFERENTIAL/PLATELET
Basophils Absolute: 0.1 10*3/uL (ref 0.0–0.1)
Basophils Relative: 1 % (ref 0.0–3.0)
Eosinophils Absolute: 0.4 10*3/uL (ref 0.0–0.7)
Eosinophils Relative: 4.3 % (ref 0.0–5.0)
HCT: 44.1 % (ref 36.0–46.0)
Hemoglobin: 15.3 g/dL — ABNORMAL HIGH (ref 12.0–15.0)
LYMPHS ABS: 4.3 10*3/uL — AB (ref 0.7–4.0)
LYMPHS PCT: 41.3 % (ref 12.0–46.0)
MCHC: 34.8 g/dL (ref 30.0–36.0)
MCV: 90 fl (ref 78.0–100.0)
MONOS PCT: 9 % (ref 3.0–12.0)
Monocytes Absolute: 0.9 10*3/uL (ref 0.1–1.0)
NEUTROS ABS: 4.6 10*3/uL (ref 1.4–7.7)
NEUTROS PCT: 44.4 % (ref 43.0–77.0)
PLATELETS: 291 10*3/uL (ref 150.0–400.0)
RBC: 4.89 Mil/uL (ref 3.87–5.11)
RDW: 13 % (ref 11.5–15.5)
WBC: 10.5 10*3/uL (ref 4.0–10.5)

## 2018-08-15 LAB — URINALYSIS, ROUTINE W REFLEX MICROSCOPIC
Ketones, ur: NEGATIVE
NITRITE: NEGATIVE
Specific Gravity, Urine: 1.015 (ref 1.000–1.030)
Total Protein, Urine: NEGATIVE
Urine Glucose: NEGATIVE
Urobilinogen, UA: 0.2 (ref 0.0–1.0)
pH: 6 (ref 5.0–8.0)

## 2018-08-15 LAB — BASIC METABOLIC PANEL
BUN: 9 mg/dL (ref 6–23)
CHLORIDE: 102 meq/L (ref 96–112)
CO2: 28 mEq/L (ref 19–32)
Calcium: 9.7 mg/dL (ref 8.4–10.5)
Creatinine, Ser: 1.04 mg/dL (ref 0.40–1.20)
GFR: 56.88 mL/min — ABNORMAL LOW (ref >60.00)
Glucose, Bld: 92 mg/dL (ref 70–99)
Potassium: 3.8 mEq/L (ref 3.5–5.1)
Sodium: 138 mEq/L (ref 135–145)

## 2018-08-15 LAB — HEPATIC FUNCTION PANEL
ALBUMIN: 4.3 g/dL (ref 3.5–5.2)
ALK PHOS: 72 U/L (ref 39–117)
ALT: 12 U/L (ref 0–35)
AST: 13 U/L (ref 0–37)
Bilirubin, Direct: 0.2 mg/dL (ref 0.0–0.3)
Total Bilirubin: 1.8 mg/dL — ABNORMAL HIGH (ref 0.2–1.2)
Total Protein: 7.4 g/dL (ref 6.0–8.3)

## 2018-08-15 LAB — LIPASE: Lipase: 41 U/L (ref 11.0–59.0)

## 2018-08-15 MED ORDER — CIPROFLOXACIN HCL 500 MG PO TABS: 500.0000 mg | ORAL_TABLET | Freq: Two times a day (BID) | ORAL | 0 refills | Status: AC

## 2018-08-15 MED ORDER — TIZANIDINE HCL 2 MG PO TABS: 2.0000 mg | ORAL_TABLET | Freq: Four times a day (QID) | ORAL | 0 refills | Status: DC | PRN

## 2018-08-15 NOTE — Patient Instructions (Signed)
Please take all new medication as prescribed - the muscle relaxer as needed  \Please continue all other medications as before, and refills have been done if requested.  Please have the pharmacy call with any other refills you may need.  Please continue your efforts at being more active, low cholesterol diet, and weight control  Please keep your appointments with your specialists as you may have planned  Please go to the LAB in the Basement (turn left off the elevator) for the tests to be done today  Depending on the results, we may need a CT scan if something is abnormal  You will be contacted by phone if any changes need to be made immediately.  Otherwise, you will receive a letter about your results with an explanation, but please check with MyChart first.  Please remember to sign up for MyChart if you have not done so, as this will be important to you in the future with finding out test results, communicating by private email, and scheduling acute appointments online when needed.

## 2018-08-15 NOTE — Telephone Encounter (Signed)
Patient phoned with right sided stomach pain(tenderness) that started during the day yesterday.Pain is worse when she is up moving around than lying down. Rates it a 4 on the scale. Stated she felt bloated several days before the pain started. She is voiding a lot more than her usual yesterday and today given she hasn't been drinking a lot due to her stomach. Belching more than usual. Denies any radiating pain/CP/SOBfever/N/V. LBM was yesterday and normal. Over the last week she has been cleaning her basement using diluted muriatic acid as well lifting 5 gallon buckets.  Instructed to remain NPO until her 2:00p appointment this afternoon with Dr. Jonny RuizJohn. Reason for Disposition . [1] MILD-MODERATE pain AND [2] constant AND [3] present > 2 hours  Answer Assessment - Initial Assessment Questions 1. LOCATION: "Where does it hurt?"      Right sided front soreness just over from belly button. 2. RADIATION: "Does the pain shoot anywhere else?" (e.g., chest, back)      no 3. ONSET: "When did the pain begin?" (e.g., minutes, hours or days ago)      Yesterday during the day 4. SUDDEN: "Gradual or sudden onset?"    Sudden but she did feel bloated 1-2 days before pain started yesterday. 5. PATTERN "Does the pain come and go, or is it constant?"    - If constant: "Is it getting better, staying the same, or worsening?"      (Note: Constant means the pain never goes away completely; most serious pain is constant and it progresses)     - If intermittent: "How long does it last?" "Do you have pain now?"     (Note: Intermittent means the pain goes away completely between bouts)     Constant with being up, but not lying down. 6. SEVERITY: "How bad is the pain?"  (e.g., Scale 1-10; mild, moderate, or severe)   - MILD (1-3): doesn't interfere with normal activities, abdomen soft and not tender to touch    - MODERATE (4-7): interferes with normal activities or awakens from sleep, tender to touch    - SEVERE (8-10):  excruciating pain, doubled over, unable to do any normal activities      4-5 but she is lying down to contain the discomfort. 7. RECURRENT SYMPTOM: "Have you ever had this type of abdominal pain before?" If so, ask: "When was the last time?" and "What happened that time?"      no 8. CAUSE: "What do you think is causing the abdominal pain?"     Unsure. Lifted a 5 gallon bucket and used muratic acid a couple days ago 9. RELIEVING/AGGRAVATING FACTORS: "What makes it better or worse?" (e.g., movement, antacids, bowel movement)     LBM yesterday. Pepto yesterday that helped ease bloating. 10. OTHER SYMPTOMS: "Has there been any vomiting, diarrhea, constipation, or urine problems?"       Voiding more than normal.  11. PREGNANCY: "Is there any chance you are pregnant?" "When was your last menstrual period?"       no  Protocols used: [facility]

## 2018-08-15 NOTE — Telephone Encounter (Signed)
FYI

## 2018-08-15 NOTE — Assessment & Plan Note (Signed)
stable overall by history and exam, and pt to continue medical treatment as before,  to f/u any worsening symptoms or concerns 

## 2018-08-15 NOTE — Assessment & Plan Note (Signed)
Denies hyper or hypo thyroid symptoms such as voice, skin or hair change.

## 2018-08-15 NOTE — Progress Notes (Signed)
Subjective:    Patient ID: Jasmine Palmer, female    DOB: 1956-11-01, 62 y.o.   MRN: [id number]  HPI  62yo F with onset yesterday AM with bloating unusual for her, then gradually worsening right sided abd pain, intermittent such that lying down still has no pain, but with any position such as turning over in bed, sitting up, standing and walking tend to make it worse, sharp and pleuritic at times, Has had some pain towards the right side (no back or blank pain) and overall sore to tougc today, and assoc with headache, and also some burning quality to the upper mid abd with urination only.  No fever, n/v, fever, or diarrhea or blood, but has had some burping and gas.  Is afraid to eat since yest am due to fear of making the pain worse.  Has been quite active with trying to fix an older home with replacing 2 floors, and painting as well, including lifting a 5 lb bucket, also with lots of right arm movement with other activities..  Pt denies chest pain, increased sob or doe, wheezing, orthopnea, PND, increased LE swelling, palpitations, dizziness or syncope.  Denies worsening reflux, dysphagia.  Denies urinary symptoms such as dysuria, frequency, urgency, flank pain, hematuria or n/v, fever, chills.Denies worsening depressive symptoms, suicidal ideation, or panic; has ongoing anxiety,Denies hyper or hypo thyroid symptoms such as voice, skin or hair change. Past Medical History:  Diagnosis Date  . ALLERGIC RHINITIS 10/09/2007  . Anxiety state, unspecified 10/10/2007  . DEPRESSION, MAJOR 09/20/2009  . HYPERLIPIDEMIA 10/10/2007  . HYPOTHYROIDISM 10/10/2007  . Hypothyroidism 12/22/2011  . INSOMNIA-SLEEP DISORDER-UNSPEC 09/27/2009  . OBESITY, HX OF 10/09/2007  . OSTEOPENIA 11/25/2010   No past surgical history on file.  reports that she has been smoking cigarettes. She has a 10.00 pack-year smoking history. She has never used smokeless tobacco. She reports that she does not drink alcohol or use drugs. family  history includes Hypertension in her father. Allergies  Allergen Reactions  . Fexofenadine   . Pravastatin Sodium     REACTION: myalgia   Current Outpatient Medications on File Prior to Visit  Medication Sig Dispense Refill  . albuterol (PROVENTIL HFA;VENTOLIN HFA) 108 (90 Base) MCG/ACT inhaler Inhale 2 puffs into the lungs every 6 (six) hours as needed for wheezing or shortness of breath. 1 Inhaler 0  . aspirin EC 81 MG tablet Take 1 tablet (81 mg total) by mouth daily. 90 tablet 11  . levothyroxine (SYNTHROID, LEVOTHROID) 25 MCG tablet Take 1 tablet (25 mcg total) by mouth daily before breakfast. 90 tablet 3  . rosuvastatin (CRESTOR) 20 MG tablet TAKE 1 TABLET BY MOUTH ONCE DAILY 90 tablet 0   No current facility-administered medications on file prior to visit.    Review of Systems  Constitutional: Negative for other unusual diaphoresis or sweats HENT: Negative for ear discharge or swelling Eyes: Negative for other worsening visual disturbances Respiratory: Negative for stridor or other swelling  Gastrointestinal: Negative for worsening distension or other blood Genitourinary: Negative for retention or other urinary change Musculoskeletal: Negative for other MSK pain or swelling Skin: Negative for color change or other new lesions Neurological: Negative for worsening tremors and other numbness  Psychiatric/Behavioral: Negative for worsening agitation or other fatigue All other system neg per pt    Objective:   Physical Exam BP 104/76   Pulse 80   Temp 98.2 F (36.8 C) (Oral)   Ht 5\' 6"  (1.676 m)   Wt 159 lb (  72.1 kg)   SpO2 95%   BMI 25.66 kg/m  VS noted,  Constitutional: Pt appears in NAD HENT: Head: NCAT.  Right Ear: External ear normal.  Left Ear: External ear normal.  Eyes: . Pupils are equal, round, and reactive to light. Conjunctivae and EOM are normal Nose: without d/c or deformity Neck: Neck supple. Gross normal ROM Cardiovascular: Normal rate and regular  rhythm.   Pulmonary/Chest: Effort normal and breath sounds without rales or wheezing.  Abd:  Soft, NT, ND, + BS, no organomegaly Neurological: Pt is alert. At baseline orientation, motor grossly intact Skin: Skin is warm. No rashes, other new lesions, no LE edema Psychiatric: Pt behavior is normal without agitation  No other exam findings Lab Results  Component Value Date   WBC 9.7 03/12/2017   HGB 15.0 03/12/2017   HCT 44.0 03/12/2017   PLT 287.0 03/12/2017   GLUCOSE 94 12/18/2011   CHOL 299 (H) 03/12/2017   TRIG 296.0 (H) 03/12/2017   HDL 57.50 03/12/2017   LDLDIRECT 195.0 03/12/2017   LDLCALC 93 04/30/2009   ALT 18 03/12/2017   AST 18 03/12/2017   NA 137 12/18/2011   K 4.6 12/18/2011   CL 104 12/18/2011   CREATININE 1.0 12/18/2011   BUN 14 12/18/2011   CO2 26 12/18/2011   TSH 6.07 (H) 03/12/2017   HGBA1C 5.7 03/12/2017       Assessment & Plan:

## 2018-08-15 NOTE — Assessment & Plan Note (Addendum)
Right sided, etiology unclear, I suspect MSk but cannot rule out other, for muscle relaxer prn, also labs as ordered  Note:  Total time for pt hx, exam, review of record with pt in the room, determination of diagnoses and plan for further eval and tx is > 40 min, with over 50% spent in coordination and counseling of patient including the differential dx, tx, further evaluation and other management of abd pain, anxiety, hypothryoidism

## 2018-08-16 ENCOUNTER — Telehealth: Payer: Self-pay

## 2018-08-16 NOTE — Telephone Encounter (Signed)
Pt has viewed results via MyChart  

## 2018-08-16 NOTE — Telephone Encounter (Signed)
-----   Message from Corwin LevinsJames W John, MD sent at 08/15/2018  7:50 PM EDT ----- Left message on MyChart, pt to cont same tx except  The test results show that your current treatment is OK, except the urine testing is suspicious for infection.  Although you may not have classic symptoms, we should probably treat with an antibiotic.  I will send the prescription, and you should hear from the office as well.Jasmine Palmer.  Shirron to please inform pt, I will do rx

## 2018-08-23 ENCOUNTER — Encounter: Payer: Self-pay | Admitting: Internal Medicine

## 2018-08-23 MED ORDER — CEPHALEXIN 500 MG PO CAPS: 500.0000 mg | ORAL_CAPSULE | Freq: Three times a day (TID) | ORAL | 0 refills | Status: AC

## 2018-09-16 NOTE — Progress Notes (Signed)
Subjective:    Patient ID: Flonnie Overman, female    DOB: 1956-01-12, 62 y.o.   MRN: [id number]  HPI She is here for an acute visit for several complaints.  Ear pain: Her ear symptoms symptoms started one year ago.  Both of her ears have been hurting and the pain often radiates down to her neck.  Her glands in her neck have also been sore and swollen and this seems to coincide with her ear pain.  Initially the symptoms were intermittent, but they have gotten worse.  Her symptoms are not daily.  She has taken Claritin on occasion and it does help, but she has not been taking it daily.  Lower chest pain: She has been experiencing intermittent discomfort and pain in her lower chest.  She noticed it is it if she eats and then goes to bed shortly after.  She has a bad habit of laying down after eating watching TV.  Initially she would feel the sensation of having air in her lower chest, but more recently it has been more painful.  When she does not eat before she goes to bed/laying down it has seemed to help.  This past weekend she had a bad episode and it was very painful.  She had gone out to dinner and had a large meal and then had a margarita.  Her symptoms are worse with alcohol.  She did take some Rolaids and Tums and that seemed to help.  She does not have daily symptoms, but this has gotten worse.  She has had a lot going on in the past year and has had more stress and has been eating more.  She is smoking.  She drinks 5-6 beers a week.  She typically has 2 cups of coffee every morning.  She takes ibuprofen only as needed.  Fatigue: She is tired all the time. She sleep 7 hrs at night and typically wakes up tired. She is occasionally restless when she sleeps but typically her sleep is good.   She thinks she has a restless night about once a week.  In the past year she has sold her house her new house which she did not like.  She is now fix that house up and is just gotten off her on the house and  will be moving again.  While fixing of her new house she was also exposed to a lot of chemicals and is unsure if that is related to any of her symptoms.  She also admits that she has not been taking her thyroid medication or cholesterol medication regularly.  Smoking: She smokes half of a pack of just over half of a pack daily.  She did quit for several years when she was pregnant.  She does want to quit and knows she should quit.  She is never taken anything to help her quit.   Medications and allergies reviewed with patient and updated if appropriate.  Patient Active Problem List   Diagnosis Date Noted  . Gastroesophageal reflux disease 09/17/2018  . Tobacco abuse 09/17/2018  . Abdominal pain 08/15/2018  . Wheezing 10/11/2017  . Cough 10/11/2017  . Degenerative arthritis of right knee 04/09/2017  . Elevated systolic blood pressure reading without diagnosis of hypertension 03/21/2017  . Right knee pain 03/13/2017  . Acute sinusitis 01/19/2014  . Exposure to viral disease 12/22/2011  . Preventative health care 12/16/2011  . OSTEOPENIA 11/25/2010  . INSOMNIA-SLEEP DISORDER-UNSPEC 09/27/2009  . DEPRESSION, MAJOR 09/20/2009  .  MENOPAUSAL DISORDER 05/11/2009  . Hypothyroidism 10/10/2007  . Hyperlipidemia 10/10/2007  . Anxiety state 10/10/2007  . Allergic rhinitis 10/09/2007  . OBESITY, HX OF 10/09/2007    Current Outpatient Medications on File Prior to Visit  Medication Sig Dispense Refill  . albuterol (PROVENTIL HFA;VENTOLIN HFA) 108 (90 Base) MCG/ACT inhaler Inhale 2 puffs into the lungs every 6 (six) hours as needed for wheezing or shortness of breath. 1 Inhaler 0  . aspirin EC 81 MG tablet Take 1 tablet (81 mg total) by mouth daily. 90 tablet 11  . levothyroxine (SYNTHROID, LEVOTHROID) 25 MCG tablet Take 1 tablet (25 mcg total) by mouth daily before breakfast. 90 tablet 3  . rosuvastatin (CRESTOR) 20 MG tablet TAKE 1 TABLET BY MOUTH ONCE DAILY 90 tablet 0  . tiZANidine (ZANAFLEX)  2 MG tablet Take 1 tablet (2 mg total) by mouth every 6 (six) hours as needed for muscle spasms. 40 tablet 0   No current facility-administered medications on file prior to visit.     Past Medical History:  Diagnosis Date  . ALLERGIC RHINITIS 10/09/2007  . Anxiety state, unspecified 10/10/2007  . DEPRESSION, MAJOR 09/20/2009  . HYPERLIPIDEMIA 10/10/2007  . HYPOTHYROIDISM 10/10/2007  . Hypothyroidism 12/22/2011  . INSOMNIA-SLEEP DISORDER-UNSPEC 09/27/2009  . OBESITY, HX OF 10/09/2007  . OSTEOPENIA 11/25/2010    No past surgical history on file.  Social History   Socioeconomic History  . Marital status: Married    Spouse name: Not on file  . Number of children: Not on file  . Years of education: Not on file  . Highest education level: Not on file  Occupational History  . Not on file  Social Needs  . Financial resource strain: Not on file  . Food insecurity:    Worry: Not on file    Inability: Not on file  . Transportation needs:    Medical: Not on file    Non-medical: Not on file  Tobacco Use  . Smoking status: Current Every Day Smoker    Packs/day: 0.50    Years: 20.00    Pack years: 10.00    Types: Cigarettes  . Smokeless tobacco: Never Used  Substance and Sexual Activity  . Alcohol use: No  . Drug use: No  . Sexual activity: Not Currently  Lifestyle  . Physical activity:    Days per week: Not on file    Minutes per session: Not on file  . Stress: Not on file  Relationships  . Social connections:    Talks on phone: Not on file    Gets together: Not on file    Attends religious service: Not on file    Active member of club or organization: Not on file    Attends meetings of clubs or organizations: Not on file    Relationship status: Not on file  Other Topics Concern  . Not on file  Social History Narrative  . Not on file    Family History  Problem Relation Age of Onset  . Hypertension Father     Review of Systems  Constitutional: Positive for  fatigue. Negative for chills and fever.  HENT: Positive for ear pain (b/l ears). Negative for congestion, sinus pressure, sinus pain, sore throat, trouble swallowing and voice change.        Swollen, tender neck glands, teeth pain  Respiratory: Positive for cough (from smoking). Negative for shortness of breath and wheezing.   Cardiovascular: Negative for chest pain, palpitations and leg swelling.  Gastrointestinal: Negative  for abdominal pain and nausea.  Neurological: Positive for headaches.       Objective:   Vitals:   09/17/18 1516  BP: 124/84  Pulse: 70  Resp: 16  Temp: 98.5 F (36.9 C)  SpO2: 97%   Filed Weights   09/17/18 1516  Weight: 166 lb 12.8 oz (75.7 kg)   Body mass index is 26.92 kg/m.  Wt Readings from Last 3 Encounters:  09/17/18 166 lb 12.8 oz (75.7 kg)  08/15/18 159 lb (72.1 kg)  03/28/18 158 lb (71.7 kg)     Physical Exam GENERAL APPEARANCE: Appears stated age, well appearing, NAD EYES: conjunctiva clear, no icterus HEENT: bilateral tympanic membranes and ear canals normal, oropharynx with no erythema, no thyromegaly, trachea midline, no cervical or supraclavicular lymphadenopathy LUNGS: Clear to auscultation without wheeze or crackles, unlabored breathing, good air entry bilaterally CARDIOVASCULAR: Normal S1,S2 without murmurs, no edema ABDOMEN: Soft, nontender, nondistended SKIN: warm, dry        Assessment & Plan:   See Problem List for Assessment and Plan of chronic medical problems.

## 2018-09-17 ENCOUNTER — Encounter: Payer: Self-pay | Admitting: Internal Medicine

## 2018-09-17 ENCOUNTER — Ambulatory Visit: Payer: BLUE CROSS/BLUE SHIELD | Admitting: Internal Medicine

## 2018-09-17 ENCOUNTER — Telehealth: Payer: Self-pay | Admitting: Internal Medicine

## 2018-09-17 VITALS — BP 124/84 | HR 70 | Temp 98.5°F | Resp 16 | Ht 66.0 in | Wt 166.8 lb

## 2018-09-17 DIAGNOSIS — J309 Allergic rhinitis, unspecified: Secondary | ICD-10-CM | POA: Diagnosis not present

## 2018-09-17 DIAGNOSIS — Z720 Tobacco use: Secondary | ICD-10-CM

## 2018-09-17 DIAGNOSIS — K219 Gastro-esophageal reflux disease without esophagitis: Secondary | ICD-10-CM

## 2018-09-17 DIAGNOSIS — E039 Hypothyroidism, unspecified: Secondary | ICD-10-CM

## 2018-09-17 MED ORDER — LEVOTHYROXINE SODIUM 25 MCG PO TABS: 25.0000 ug | ORAL_TABLET | Freq: Every day | ORAL | 0 refills | Status: DC

## 2018-09-17 MED ORDER — FAMOTIDINE 40 MG PO TABS: 40.0000 mg | ORAL_TABLET | Freq: Every day | ORAL | 5 refills | Status: DC

## 2018-09-17 NOTE — Assessment & Plan Note (Signed)
Has not been taking her thyroid medication consistently, which is likely 1 of the main reasons for her fatigue We will not check her thyroid at this time-advised that she start her medication and take it daily If her fatigue does not improve she should return for further evaluation

## 2018-09-17 NOTE — Telephone Encounter (Signed)
Pt came back in after her appt this afternoon and stated she needs refill on generic Synthroid.  Pharmacy is [facility] in [place].

## 2018-09-17 NOTE — Assessment & Plan Note (Signed)
Lower chest pain, burping and exacerbating symptoms consistent with GERD Start Pepcid 40 mg nightly Reviewed GERD diet Stressed smoking cessation Call or return if no improvement

## 2018-09-17 NOTE — Patient Instructions (Addendum)
Start pepcid 40 mg in the evening or at bedtime.   Revise your diet and try not to eat within three hours of laying down.  Start Claritin for your ear symptoms, which his related to allergies and take daily for now.   Start taking the thyroid medication consistently.  This is likely the reason for your fatigue.    Stop smoking.      Gastroesophageal Reflux Disease, Adult Normally, food travels down the esophagus and stays in the stomach to be digested. However, when a person has gastroesophageal reflux disease (GERD), food and stomach acid move back up into the esophagus. When this happens, the esophagus becomes sore and inflamed. Over time, GERD can create small holes (ulcers) in the lining of the esophagus. What are the causes? This condition is caused by a problem with the muscle between the esophagus and the stomach (lower esophageal sphincter, or LES). Normally, the LES muscle closes after food passes through the esophagus to the stomach. When the LES is weakened or abnormal, it does not close properly, and that allows food and stomach acid to go back up into the esophagus. The LES can be weakened by certain dietary substances, medicines, and medical conditions, including:  Tobacco use.  Pregnancy.  Having a hiatal hernia.  Heavy alcohol use.  Certain foods and beverages, such as coffee, chocolate, onions, and peppermint.  What increases the risk? This condition is more likely to develop in:  People who have an increased body weight.  People who have connective tissue disorders.  People who use NSAID medicines.  What are the signs or symptoms? Symptoms of this condition include:  Heartburn.  Difficult or painful swallowing.  The feeling of having a lump in the throat.  Abitter taste in the mouth.  Bad breath.  Having a large amount of saliva.  Having an upset or bloated stomach.  Belching.  Chest pain.  Shortness of breath or wheezing.  Ongoing (chronic)  cough or a night-time cough.  Wearing away of tooth enamel.  Weight loss.  Different conditions can cause chest pain. Make sure to see your health care provider if you experience chest pain. How is this diagnosed? Your health care provider will take a medical history and perform a physical exam. To determine if you have mild or severe GERD, your health care provider may also monitor how you respond to treatment. You may also have other tests, including:  An endoscopy toexamine your stomach and esophagus with a small camera.  A test thatmeasures the acidity level in your esophagus.  A test thatmeasures how much pressure is on your esophagus.  A barium swallow or modified barium swallow to show the shape, size, and functioning of your esophagus.  How is this treated? The goal of treatment is to help relieve your symptoms and to prevent complications. Treatment for this condition may vary depending on how severe your symptoms are. Your health care provider may recommend:  Changes to your diet.  Medicine.  Surgery.  Follow these instructions at home: Diet  Follow a diet as recommended by your health care provider. This may involve avoiding foods and drinks such as: ? Coffee and tea (with or without caffeine). ? Drinks that containalcohol. ? Energy drinks and sports drinks. ? Carbonated drinks or sodas. ? Chocolate and cocoa. ? Peppermint and mint flavorings. ? Garlic and onions. ? Horseradish. ? Spicy and acidic foods, including peppers, chili powder, curry powder, vinegar, hot sauces, and barbecue sauce. ? Citrus fruit juices  and citrus fruits, such as oranges, lemons, and limes. ? Tomato-based foods, such as red sauce, chili, salsa, and pizza with red sauce. ? Fried and fatty foods, such as donuts, french fries, potato chips, and high-fat dressings. ? High-fat meats, such as hot dogs and fatty cuts of red and white meats, such as rib eye steak, sausage, ham, and  bacon. ? High-fat dairy items, such as whole milk, butter, and cream cheese.  Eat small, frequent meals instead of large meals.  Avoid drinking large amounts of liquid with your meals.  Avoid eating meals during the 2-3 hours before bedtime.  Avoid lying down right after you eat.  Do not exercise right after you eat. General instructions  Pay attention to any changes in your symptoms.  Take over-the-counter and prescription medicines only as told by your health care provider. Do not take aspirin, ibuprofen, or other NSAIDs unless your health care provider told you to do so.  Do not use any tobacco products, including cigarettes, chewing tobacco, and e-cigarettes. If you need help quitting, ask your health care provider.  Wear loose-fitting clothing. Do not wear anything tight around your waist that causes pressure on your abdomen.  Raise (elevate) the head of your bed 6 inches (15cm).  Try to reduce your stress, such as with yoga or meditation. If you need help reducing stress, ask your health care provider.  If you are overweight, reduce your weight to an amount that is healthy for you. Ask your health care provider for guidance about a safe weight loss goal.  Keep all follow-up visits as told by your health care provider. This is important. Contact a health care provider if:  You have new symptoms.  You have unexplained weight loss.  You have difficulty swallowing, or it hurts to swallow.  You have wheezing or a persistent cough.  Your symptoms do not improve with treatment.  You have a hoarse voice. Get help right away if:  You have pain in your arms, neck, jaw, teeth, or back.  You feel sweaty, dizzy, or light-headed.  You have chest pain or shortness of breath.  You vomit and your vomit looks like blood or coffee grounds.  You faint.  Your stool is bloody or black.  You cannot swallow, drink, or eat. This information is not intended to replace advice  given to you by your health care provider. Make sure you discuss any questions you have with your health care provider. Document Released: 08/30/2005 Document Revised: 04/19/2016 Document Reviewed: 03/17/2015 Elsevier Interactive Patient Education  [employer].

## 2018-09-17 NOTE — Assessment & Plan Note (Addendum)
Smoking cessation was discussed for more than 3 minutes.  The patient was counseled on the dangers of tobacco use, and was advised to quit and wants to quit.  Discussed that this is likely contributing to possibly some of her allergy symptoms and definitely her heartburn symptoms.  She is also noticed that her breathing is not as good.  Reviewed ways of quitting smoking including nicotine replacement, vapping/e-cigarettes, cold [place], weaning off cigarettes, and pharmacotherapy (wellbutrin and chantix).  She did quit cold [place] last time and if she tries to quit again will try to quit cold [place].

## 2018-09-17 NOTE — Assessment & Plan Note (Addendum)
Swollen glands likely related to allergies She states Claritin has helped in the past, but she is never taking it consistently Start Claritin daily Advised smoking cessation

## 2018-09-18 NOTE — Telephone Encounter (Signed)
Rx sent 

## 2018-11-28 DIAGNOSIS — Z1231 Encounter for screening mammogram for malignant neoplasm of breast: Secondary | ICD-10-CM | POA: Diagnosis not present

## 2018-11-28 LAB — HM MAMMOGRAPHY

## 2018-12-05 ENCOUNTER — Encounter: Payer: Self-pay | Admitting: Internal Medicine

## 2018-12-05 DIAGNOSIS — Z0000 Encounter for general adult medical examination without abnormal findings: Secondary | ICD-10-CM

## 2018-12-13 ENCOUNTER — Other Ambulatory Visit (INDEPENDENT_AMBULATORY_CARE_PROVIDER_SITE_OTHER): Payer: BLUE CROSS/BLUE SHIELD

## 2018-12-13 DIAGNOSIS — Z0000 Encounter for general adult medical examination without abnormal findings: Secondary | ICD-10-CM | POA: Diagnosis not present

## 2018-12-13 LAB — URINALYSIS, ROUTINE W REFLEX MICROSCOPIC
Bilirubin Urine: NEGATIVE
Ketones, ur: NEGATIVE
NITRITE: NEGATIVE
Specific Gravity, Urine: 1.01 (ref 1.000–1.030)
Total Protein, Urine: NEGATIVE
Urine Glucose: NEGATIVE
Urobilinogen, UA: 0.2 (ref 0.0–1.0)
pH: 6 (ref 5.0–8.0)

## 2018-12-13 LAB — CBC WITH DIFFERENTIAL/PLATELET
Basophils Absolute: 0.1 10*3/uL (ref 0.0–0.1)
Basophils Relative: 0.8 % (ref 0.0–3.0)
Eosinophils Absolute: 0.6 10*3/uL (ref 0.0–0.7)
Eosinophils Relative: 5.7 % — ABNORMAL HIGH (ref 0.0–5.0)
HCT: 40.5 % (ref 36.0–46.0)
Hemoglobin: 13.9 g/dL (ref 12.0–15.0)
LYMPHS ABS: 4.8 10*3/uL — AB (ref 0.7–4.0)
Lymphocytes Relative: 46.7 % — ABNORMAL HIGH (ref 12.0–46.0)
MCHC: 34.2 g/dL (ref 30.0–36.0)
MCV: 89.5 fl (ref 78.0–100.0)
MONO ABS: 1.1 10*3/uL — AB (ref 0.1–1.0)
Monocytes Relative: 10.2 % (ref 3.0–12.0)
Neutro Abs: 3.8 10*3/uL (ref 1.4–7.7)
Neutrophils Relative %: 36.6 % — ABNORMAL LOW (ref 43.0–77.0)
Platelets: 272 10*3/uL (ref 150.0–400.0)
RBC: 4.52 Mil/uL (ref 3.87–5.11)
RDW: 13.1 % (ref 11.5–15.5)
WBC: 10.3 10*3/uL (ref 4.0–10.5)

## 2018-12-13 LAB — HEPATIC FUNCTION PANEL
ALT: 22 U/L (ref 0–35)
AST: 20 U/L (ref 0–37)
Albumin: 4.3 g/dL (ref 3.5–5.2)
Alkaline Phosphatase: 62 U/L (ref 39–117)
BILIRUBIN TOTAL: 0.9 mg/dL (ref 0.2–1.2)
Bilirubin, Direct: 0.2 mg/dL (ref 0.0–0.3)
Total Protein: 6.7 g/dL (ref 6.0–8.3)

## 2018-12-13 LAB — BASIC METABOLIC PANEL
BUN: 11 mg/dL (ref 6–23)
CO2: 29 meq/L (ref 19–32)
Calcium: 9.5 mg/dL (ref 8.4–10.5)
Chloride: 104 mEq/L (ref 96–112)
Creatinine, Ser: 0.86 mg/dL (ref 0.40–1.20)
GFR: 70.75 mL/min (ref >60.00)
Glucose, Bld: 97 mg/dL (ref 70–99)
Potassium: 3.7 mEq/L (ref 3.5–5.1)
Sodium: 140 mEq/L (ref 135–145)

## 2018-12-13 LAB — LIPID PANEL
Cholesterol: 155 mg/dL (ref 0–200)
HDL: 60.5 mg/dL (ref >39.00)
NonHDL: 94.81
Total CHOL/HDL Ratio: 3
Triglycerides: 235 mg/dL — ABNORMAL HIGH (ref 0.0–149.0)
VLDL: 47 mg/dL — AB (ref 0.0–40.0)

## 2018-12-13 LAB — TSH: TSH: 2.76 u[IU]/mL (ref 0.35–4.50)

## 2018-12-13 LAB — LDL CHOLESTEROL, DIRECT: Direct LDL: 71 mg/dL

## 2018-12-16 ENCOUNTER — Encounter: Payer: Self-pay | Admitting: Internal Medicine

## 2018-12-16 ENCOUNTER — Ambulatory Visit (INDEPENDENT_AMBULATORY_CARE_PROVIDER_SITE_OTHER): Payer: BLUE CROSS/BLUE SHIELD | Admitting: Internal Medicine

## 2018-12-16 VITALS — BP 122/76 | HR 78 | Temp 98.0°F | Ht 66.0 in | Wt 170.0 lb

## 2018-12-16 DIAGNOSIS — E039 Hypothyroidism, unspecified: Secondary | ICD-10-CM

## 2018-12-16 DIAGNOSIS — Z0000 Encounter for general adult medical examination without abnormal findings: Secondary | ICD-10-CM | POA: Diagnosis not present

## 2018-12-16 DIAGNOSIS — K219 Gastro-esophageal reflux disease without esophagitis: Secondary | ICD-10-CM | POA: Diagnosis not present

## 2018-12-16 MED ORDER — FLUTICASONE PROPIONATE 50 MCG/ACT NA SUSP: 2.0000 | Freq: Every day | NASAL | 6 refills | Status: DC

## 2018-12-16 MED ORDER — LEVOTHYROXINE SODIUM 25 MCG PO TABS: 25.0000 ug | ORAL_TABLET | Freq: Every day | ORAL | 3 refills | Status: DC

## 2018-12-16 NOTE — Assessment & Plan Note (Signed)
Worsening with wt change, for pepcid daily instead of prn

## 2018-12-16 NOTE — Assessment & Plan Note (Signed)

## 2018-12-16 NOTE — Patient Instructions (Addendum)
Please call if your Cologuard is covered by the insurance.  Please continue all other medications as before, and refills have been done if requested.  Please have the pharmacy call with any other refills you may need.  Please continue your efforts at being more active, low cholesterol diet, and weight control.  You are otherwise up to date with prevention measures today.  Please keep your appointments with your specialists as you may have planned  Please return in 1 year for your yearly visit, or sooner if needed, with Lab testing done 3-5 days before

## 2018-12-16 NOTE — Assessment & Plan Note (Signed)
stable overall by history and exam, recent data reviewed with pt, and pt to continue medical treatment as before,  to f/u any worsening symptoms or concerns  

## 2018-12-16 NOTE — Progress Notes (Signed)
Subjective:    Patient ID: Jasmine OvermanBetty Palmer, female    DOB: 12/04/1956, 63 y.o.   MRN: [id number]  HPI  Here for wellness and f/u;  Overall doing ok;  Pt denies Chest pain, worsening SOB, DOE, wheezing, orthopnea, PND, worsening LE edema, palpitations, dizziness or syncope.  Pt denies neurological change such as new headache, facial or extremity weakness.  Pt denies polydipsia, polyuria, or low sugar symptoms. Pt states overall good compliance with treatment and medications, good tolerability, and has been trying to follow appropriate diet.  Pt denies worsening depressive symptoms, suicidal ideation or panic. No fever, night sweats, wt loss, loss of appetite, or other constitutional symptoms.  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  Was more active to mid November but less active recently, with some wt gain. Reflux worse over the past yr after wt gain as well. Denies worsening abd pain, dysphagia, n/v, bowel change or blood. Has intermitent gland swelling and neck pain and soreness with ear pain, plans to see ENT  TUMS seems to helps this as well. Only takes the pepcid every few days.  No other new complaints Past Medical History:  Diagnosis Date  . ALLERGIC RHINITIS 10/09/2007  . Anxiety state, unspecified 10/10/2007  . DEPRESSION, MAJOR 09/20/2009  . HYPERLIPIDEMIA 10/10/2007  . HYPOTHYROIDISM 10/10/2007  . Hypothyroidism 12/22/2011  . INSOMNIA-SLEEP DISORDER-UNSPEC 09/27/2009  . OBESITY, HX OF 10/09/2007  . OSTEOPENIA 11/25/2010   No past surgical history on file.  reports that she has been smoking cigarettes. She has a 10.00 pack-year smoking history. She has never used smokeless tobacco. She reports that she does not drink alcohol or use drugs. family history includes Hypertension in her  father. Allergies  Allergen Reactions  . Ciprofloxacin Other (See Comments)    GI upset only  . Fexofenadine   . Pravastatin Sodium     REACTION: myalgia   Current Outpatient Medications on File Prior to Visit  Medication Sig Dispense Refill  . albuterol (PROVENTIL HFA;VENTOLIN HFA) 108 (90 Base) MCG/ACT inhaler Inhale 2 puffs into the lungs every 6 (six) hours as needed for wheezing or shortness of breath. 1 Inhaler 0  . aspirin EC 81 MG tablet Take 1 tablet (81 mg total) by mouth daily. 90 tablet 11  . famotidine (PEPCID) 40 MG tablet Take 1 tablet (40 mg total) by mouth daily. 30 tablet 5  . rosuvastatin (CRESTOR) 20 MG tablet TAKE 1 TABLET BY MOUTH ONCE DAILY 90 tablet 0  . tiZANidine (ZANAFLEX) 2 MG tablet Take 1 tablet (2 mg total) by mouth every 6 (six) hours as needed for muscle spasms. 40 tablet 0   No current facility-administered medications on file prior to visit.    Review of Systems Constitutional: Negative for other unusual diaphoresis, sweats, appetite or weight changes HENT: Negative for other worsening hearing loss, ear pain, facial swelling, mouth sores or neck stiffness.   Eyes: Negative for other worsening pain, redness or other visual disturbance.  Respiratory: Negative for other stridor or swelling Cardiovascular: Negative for other palpitations or other chest pain  Gastrointestinal: Negative for worsening diarrhea or loose stools, blood in stool, distention or other pain Genitourinary: Negative for hematuria, flank pain or other change in urine volume.  Musculoskeletal: Negative for myalgias or other joint  swelling.  Skin: Negative for other color change, or other wound or worsening drainage.  Neurological: Negative for other syncope or numbness. Hematological: Negative for other adenopathy or swelling Psychiatric/Behavioral: Negative for hallucinations, other worsening agitation, SI, self-injury, or new decreased concentration All other system neg per pt     Objective:   Physical Exam BP 122/76   Pulse 78   Temp 98 F (36.7 C) (Oral)   Ht 5\' 6"  (1.676 m)   Wt 170 lb (77.1 kg)   SpO2 97%   BMI 27.44 kg/m  VS noted, obese Constitutional: Pt is oriented to person, place, and time. Appears well-developed and well-nourished, in no significant distress and comfortable Head: Normocephalic and atraumatic  Eyes: Conjunctivae and EOM are normal. Pupils are equal, round, and reactive to light Right Ear: External ear normal without discharge Left Ear: External ear normal without discharge Nose: Nose without discharge or deformity Mouth/Throat: Oropharynx is without other ulcerations and moist  Neck: Normal range of motion. Neck supple. No JVD present. No tracheal deviation present or significant neck LA or mass Cardiovascular: Normal rate, regular rhythm, normal heart sounds and intact distal pulses.   Pulmonary/Chest: WOB normal and breath sounds without rales or wheezing  Abdominal: Soft. Bowel sounds are normal. NT. No HSM  Musculoskeletal: Normal range of motion. Exhibits no edema Lymphadenopathy: Has no other cervical adenopathy.  Neurological: Pt is alert and oriented to person, place, and time. Pt has normal reflexes. No cranial nerve deficit. Motor grossly intact, Gait intact Skin: Skin is warm and dry. No rash noted or new ulcerations Psychiatric:  Has normal mood and affect. Behavior is normal without agitation No other exam findings Lab Results  Component Value Date   WBC 10.3 12/13/2018   HGB 13.9 12/13/2018   HCT 40.5 12/13/2018   PLT 272.0 12/13/2018   GLUCOSE 97 12/13/2018   CHOL 155 12/13/2018   TRIG 235.0 (H) 12/13/2018   HDL 60.50 12/13/2018   LDLDIRECT 71.0 12/13/2018   LDLCALC 93 04/30/2009   ALT 22 12/13/2018   AST 20 12/13/2018   NA 140 12/13/2018   K 3.7 12/13/2018   CL 104 12/13/2018   CREATININE 0.86 12/13/2018   BUN 11 12/13/2018   CO2 29 12/13/2018   TSH 2.76 12/13/2018   HGBA1C 5.7 03/12/2017           Assessment & Plan:

## 2018-12-26 ENCOUNTER — Encounter: Payer: Self-pay | Admitting: Internal Medicine

## 2019-01-01 DIAGNOSIS — K219 Gastro-esophageal reflux disease without esophagitis: Secondary | ICD-10-CM | POA: Diagnosis not present

## 2019-01-01 DIAGNOSIS — F17210 Nicotine dependence, cigarettes, uncomplicated: Secondary | ICD-10-CM | POA: Diagnosis not present

## 2019-01-01 DIAGNOSIS — H9209 Otalgia, unspecified ear: Secondary | ICD-10-CM | POA: Diagnosis not present

## 2019-01-01 DIAGNOSIS — R070 Pain in throat: Secondary | ICD-10-CM | POA: Diagnosis not present

## 2019-01-06 DIAGNOSIS — R51 Headache: Secondary | ICD-10-CM | POA: Diagnosis not present

## 2019-02-26 ENCOUNTER — Encounter: Payer: Self-pay | Admitting: Internal Medicine

## 2019-02-26 MED ORDER — MECLIZINE HCL 12.5 MG PO TABS: 12.5000 mg | ORAL_TABLET | Freq: Three times a day (TID) | ORAL | 1 refills | Status: DC | PRN

## 2019-02-26 NOTE — Telephone Encounter (Signed)
Ok for meclizine prn

## 2019-05-21 ENCOUNTER — Encounter: Payer: Self-pay | Admitting: Internal Medicine

## 2019-05-21 ENCOUNTER — Ambulatory Visit (INDEPENDENT_AMBULATORY_CARE_PROVIDER_SITE_OTHER): Payer: BC Managed Care – PPO | Admitting: Internal Medicine

## 2019-05-21 DIAGNOSIS — R51 Headache: Secondary | ICD-10-CM | POA: Diagnosis not present

## 2019-05-21 DIAGNOSIS — K219 Gastro-esophageal reflux disease without esophagitis: Secondary | ICD-10-CM | POA: Diagnosis not present

## 2019-05-21 DIAGNOSIS — R519 Headache, unspecified: Secondary | ICD-10-CM

## 2019-05-21 DIAGNOSIS — F411 Generalized anxiety disorder: Secondary | ICD-10-CM

## 2019-05-21 MED ORDER — PANTOPRAZOLE SODIUM 40 MG PO TBEC: 40.0000 mg | DELAYED_RELEASE_TABLET | Freq: Every day | ORAL | 3 refills | Status: DC

## 2019-05-21 MED ORDER — TRAMADOL HCL 50 MG PO TABS: 50.0000 mg | ORAL_TABLET | Freq: Four times a day (QID) | ORAL | 0 refills | Status: DC | PRN

## 2019-05-21 MED ORDER — CARBAMAZEPINE 100 MG PO CHEW: 100.0000 mg | CHEWABLE_TABLET | Freq: Two times a day (BID) | ORAL | 5 refills | Status: DC

## 2019-05-21 MED ORDER — CYCLOBENZAPRINE HCL 10 MG PO TABS: 10.0000 mg | ORAL_TABLET | Freq: Three times a day (TID) | ORAL | 1 refills | Status: DC | PRN

## 2019-05-21 NOTE — Patient Instructions (Addendum)
Please take all new medication as prescribed - the tramadol for pain, tegretol at 100 mg twice per day for possible nerve pain, and protonix for acid  The tegretol can be increased to 200 mg twice per day if needed, just let us know  We are only allowed to send 30 tabs of tramadol to start, but let us know if this is needed further after that  You can most likely stop the pepcid if you take the protonix, but some people do better taking both every day  Please continue all other medications as before, and refills have been done if requested - the flexeril  Please have the pharmacy call with any other refills you may need.  Please keep your appointments with your specialists as you may have planned

## 2019-05-21 NOTE — Progress Notes (Signed)
Patient ID: Jasmine Palmer, female   DOB: 1956-08-17, 64 y.o.   MRN: [id number]  Virtual Visit via Video Note  I connected with Jasmine Palmer on 05/21/19 at  4:00 PM EDT by a video enabled telemedicine application and verified that I am speaking with the correct person using two identifiers.  Location: Patient: at home Provider: at office   I discussed the limitations of evaluation and management by telemedicine and the availability of in person appointments. The patient expressed understanding and agreed to proceed.  History of Present Illness: Here to f/u with c/o persistent right facial pain, sharp, mostly constant mild to mod but often severe, worse in front of the right ear, worse to eat, can involve the whole right face, but no swelling, rash, but ongoing for over 1 mo.  Has seen dental, oral surgeon, and even ENT who confirmed probable TMJ syndrome, but she is not quite convinced and believes a tooth issue and/or persistent reflux also may be somehow related. Denies abd pain, dysphagia, n/v, bowel change or blood.  Pt mentions a panoramic xray that did not show arthritis. Denies worsening depressive symptoms, suicidal ideation, or panic Past Medical History:  Diagnosis Date  . ALLERGIC RHINITIS 10/09/2007  . Anxiety state, unspecified 10/10/2007  . DEPRESSION, MAJOR 09/20/2009  . HYPERLIPIDEMIA 10/10/2007  . HYPOTHYROIDISM 10/10/2007  . Hypothyroidism 12/22/2011  . INSOMNIA-SLEEP DISORDER-UNSPEC 09/27/2009  . OBESITY, HX OF 10/09/2007  . OSTEOPENIA 11/25/2010   No past surgical history on file.  reports that she has been smoking cigarettes. She has a 10.00 pack-year smoking history. She has never used smokeless tobacco. She reports that she does not drink alcohol or use drugs. family history includes Hypertension in her father. Allergies  Allergen Reactions  . Ciprofloxacin Other (See Comments)    GI upset only  . Fexofenadine   . Pravastatin Sodium     REACTION: myalgia    Current Outpatient Medications on File Prior to Visit  Medication Sig Dispense Refill  . albuterol (PROVENTIL HFA;VENTOLIN HFA) 108 (90 Base) MCG/ACT inhaler Inhale 2 puffs into the lungs every 6 (six) hours as needed for wheezing or shortness of breath. 1 Inhaler 0  . aspirin EC 81 MG tablet Take 1 tablet (81 mg total) by mouth daily. 90 tablet 11  . cyclobenzaprine (FLEXERIL) 10 MG tablet Take 1 tablet (10 mg total) by mouth 3 (three) times daily as needed for muscle spasms. 60 tablet 1  . famotidine (PEPCID) 40 MG tablet Take 1 tablet (40 mg total) by mouth daily. 30 tablet 5  . fluticasone (FLONASE) 50 MCG/ACT nasal spray Place 2 sprays into both nostrils daily. 16 g 6  . levothyroxine (SYNTHROID, LEVOTHROID) 25 MCG tablet Take 1 tablet (25 mcg total) by mouth daily before breakfast. 90 tablet 3  . meclizine (ANTIVERT) 12.5 MG tablet Take 1 tablet (12.5 mg total) by mouth 3 (three) times daily as needed for dizziness. 30 tablet 1  . rosuvastatin (CRESTOR) 20 MG tablet TAKE 1 TABLET BY MOUTH ONCE DAILY 90 tablet 0  . tiZANidine (ZANAFLEX) 2 MG tablet Take 1 tablet (2 mg total) by mouth every 6 (six) hours as needed for muscle spasms. 40 tablet 0   No current facility-administered medications on file prior to visit.     Observations/Objective: Alert, NAD, appropriate mood and affect, resps normal, cn 2-12 intact, moves all 4s, no visible rash or swelling Lab Results  Component Value Date   WBC 10.3 12/13/2018   HGB 13.9 12/13/2018  HCT 40.5 12/13/2018   PLT 272.0 12/13/2018   GLUCOSE 97 12/13/2018   CHOL 155 12/13/2018   TRIG 235.0 (H) 12/13/2018   HDL 60.50 12/13/2018   LDLDIRECT 71.0 12/13/2018   LDLCALC 93 04/30/2009   ALT 22 12/13/2018   AST 20 12/13/2018   NA 140 12/13/2018   K 3.7 12/13/2018   CL 104 12/13/2018   CREATININE 0.86 12/13/2018   BUN 11 12/13/2018   CO2 29 12/13/2018   TSH 2.76 12/13/2018   HGBA1C 5.7 03/12/2017   Assessment and Plan: See  notes  Follow Up Instructions: See notes   I discussed the assessment and treatment plan with the patient. The patient was provided an opportunity to ask questions and all were answered. The patient agreed with the plan and demonstrated an understanding of the instructions.   The patient was advised to call back or seek an in-person evaluation if the symptoms worsen or if the condition fails to improve as anticipated.   Oliver BarreJames Terran Klinke, MD

## 2019-05-21 NOTE — Assessment & Plan Note (Signed)
Mild to mod, for protonix 40 qd,,  to f/u any worsening symptoms or concerns

## 2019-05-21 NOTE — Assessment & Plan Note (Signed)
stable overall by history and exam, recent data reviewed with pt, and pt to continue medical treatment as before,  to f/u any worsening symptoms or concerns  

## 2019-05-21 NOTE — Assessment & Plan Note (Signed)
Differential could include TMJ ando/or dental; doubt reflux related but seems reasonable to consider trigeminal neuralgia as well; ok for tramadol prn pain, flexeril 10 prn, and trial tegretol 100 bid, considier MRI and/or NS referrals but declines for now

## 2019-05-23 DIAGNOSIS — R42 Dizziness and giddiness: Secondary | ICD-10-CM | POA: Diagnosis not present

## 2019-05-23 DIAGNOSIS — H8111 Benign paroxysmal vertigo, right ear: Secondary | ICD-10-CM | POA: Diagnosis not present

## 2019-05-23 DIAGNOSIS — H9209 Otalgia, unspecified ear: Secondary | ICD-10-CM | POA: Diagnosis not present

## 2019-07-17 DIAGNOSIS — R42 Dizziness and giddiness: Secondary | ICD-10-CM | POA: Diagnosis not present

## 2019-07-31 ENCOUNTER — Encounter: Payer: Self-pay | Admitting: Internal Medicine

## 2019-07-31 MED ORDER — ZOSTER VAC RECOMB ADJUVANTED 50 MCG/0.5ML IM SUSR: 0.5000 mL | Freq: Once | INTRAMUSCULAR | 1 refills | Status: AC

## 2019-10-28 ENCOUNTER — Other Ambulatory Visit: Payer: Self-pay | Admitting: Internal Medicine

## 2019-11-04 ENCOUNTER — Encounter: Payer: Self-pay | Admitting: Internal Medicine

## 2019-11-04 DIAGNOSIS — U071 COVID-19: Secondary | ICD-10-CM

## 2019-12-02 ENCOUNTER — Encounter: Payer: Self-pay | Admitting: Internal Medicine

## 2019-12-02 ENCOUNTER — Ambulatory Visit (INDEPENDENT_AMBULATORY_CARE_PROVIDER_SITE_OTHER): Payer: BC Managed Care – PPO | Admitting: Internal Medicine

## 2019-12-02 DIAGNOSIS — F411 Generalized anxiety disorder: Secondary | ICD-10-CM

## 2019-12-02 DIAGNOSIS — Z203 Contact with and (suspected) exposure to rabies: Secondary | ICD-10-CM | POA: Diagnosis not present

## 2019-12-02 DIAGNOSIS — S40812A Abrasion of left upper arm, initial encounter: Secondary | ICD-10-CM | POA: Diagnosis not present

## 2019-12-02 NOTE — Progress Notes (Signed)
Patient ID: Jasmine Palmer, female   DOB: 10/01/1956, 63 y.o.   MRN: [id number]  Virtual Visit via Video Note  I connected with Jasmine Palmer on 12/02/19 at  1:40 PM EST by a video enabled telemedicine application and verified that I am speaking with the correct person using two identifiers.  Location: Patient: at home Provider: at office   I discussed the limitations of evaluation and management by telemedicine and the availability of in person appointments. The patient expressed understanding and agreed to proceed.  History of Present Illness: Here after scratched dec 23 by dog (no dog bite) after dog had just killed skunk in the yard; skunk currently being tested for rabies, and dogs already vaccinated, and scratch to left mid forearm is slightly red but no worsening pain, red, fever; asks for letter to employer that no rabies vaccination needed.  Pt denies chest pain, increased sob or doe, wheezing, orthopnea, PND, increased LE swelling, palpitations, dizziness or syncope.  Pt denies fever, wt loss, night sweats, loss of appetite, or other constitutional symptoms  Pt denies new neurological symptoms such as new headache, or facial or extremity weakness or numbness   Pt denies polydipsia, polyuria,  Denies worsening depressive symptoms, suicidal ideation, or panic Past Medical History:  Diagnosis Date  . ALLERGIC RHINITIS 10/09/2007  . Anxiety state, unspecified 10/10/2007  . DEPRESSION, MAJOR 09/20/2009  . HYPERLIPIDEMIA 10/10/2007  . HYPOTHYROIDISM 10/10/2007  . Hypothyroidism 12/22/2011  . INSOMNIA-SLEEP DISORDER-UNSPEC 09/27/2009  . OBESITY, HX OF 10/09/2007  . OSTEOPENIA 11/25/2010   No past surgical history on file.  reports that she has been smoking cigarettes. She has a 10.00 pack-year smoking history. She has never used smokeless tobacco. She reports that she does not drink alcohol or use drugs. family history includes Hypertension in her father. Allergies  Allergen Reactions  .  Ciprofloxacin Other (See Comments)    GI upset only  . Fexofenadine   . Pravastatin Sodium     REACTION: myalgia   Current Outpatient Medications on File Prior to Visit  Medication Sig Dispense Refill  . albuterol (PROVENTIL HFA;VENTOLIN HFA) 108 (90 Base) MCG/ACT inhaler Inhale 2 puffs into the lungs every 6 (six) hours as needed for wheezing or shortness of breath. 1 Inhaler 0  . aspirin EC 81 MG tablet Take 1 tablet (81 mg total) by mouth daily. 90 tablet 11  . carbamazepine (TEGRETOL) 100 MG chewable tablet Chew 1 tablet (100 mg total) by mouth 2 (two) times daily. 60 tablet 5  . cyclobenzaprine (FLEXERIL) 10 MG tablet Take 1 tablet (10 mg total) by mouth 3 (three) times daily as needed for muscle spasms. 60 tablet 1  . famotidine (PEPCID) 40 MG tablet Take 1 tablet by mouth once daily 30 tablet 5  . fluticasone (FLONASE) 50 MCG/ACT nasal spray Place 2 sprays into both nostrils daily. 16 g 6  . levothyroxine (SYNTHROID, LEVOTHROID) 25 MCG tablet Take 1 tablet (25 mcg total) by mouth daily before breakfast. 90 tablet 3  . meclizine (ANTIVERT) 12.5 MG tablet Take 1 tablet (12.5 mg total) by mouth 3 (three) times daily as needed for dizziness. 30 tablet 1  . pantoprazole (PROTONIX) 40 MG tablet Take 1 tablet (40 mg total) by mouth daily. 90 tablet 3  . rosuvastatin (CRESTOR) 20 MG tablet Take 1 tablet by mouth once daily 90 tablet 0  . tiZANidine (ZANAFLEX) 2 MG tablet Take 1 tablet (2 mg total) by mouth every 6 (six) hours as needed for muscle spasms. 40  tablet 0  . traMADol (ULTRAM) 50 MG tablet Take 1 tablet (50 mg total) by mouth every 6 (six) hours as needed. (Patient not taking: Reported on 12/02/2019) 30 tablet 0   No current facility-administered medications on file prior to visit.    Observations/Objective: Alert, NAD, appropriate mood and affect, resps normal, cn 2-12 intact, moves all 4s, no visible rash or swelling Lab Results  Component Value Date   WBC 10.3 12/13/2018   HGB  13.9 12/13/2018   HCT 40.5 12/13/2018   PLT 272.0 12/13/2018   GLUCOSE 97 12/13/2018   CHOL 155 12/13/2018   TRIG 235.0 (H) 12/13/2018   HDL 60.50 12/13/2018   LDLDIRECT 71.0 12/13/2018   LDLCALC 93 04/30/2009   ALT 22 12/13/2018   AST 20 12/13/2018   NA 140 12/13/2018   K 3.7 12/13/2018   CL 104 12/13/2018   CREATININE 0.86 12/13/2018   BUN 11 12/13/2018   CO2 29 12/13/2018   TSH 2.76 12/13/2018   HGBA1C 5.7 03/12/2017   Assessment and Plan: See notes  Follow Up Instructions: See notes   I discussed the assessment and treatment plan with the patient. The patient was provided an opportunity to ask questions and all were answered. The patient agreed with the plan and demonstrated an understanding of the instructions.   The patient was advised to call back or seek an in-person evaluation if the symptoms worsen or if the condition fails to improve as anticipated.  Cathlean Cower, MD

## 2019-12-02 NOTE — Assessment & Plan Note (Signed)
Not clear but very unlikely, and skunk being tested now, then scratched by dog only (no bite); ok to hold on rabies vaccine tx at this time (note to be sent to pt)

## 2019-12-03 ENCOUNTER — Telehealth: Payer: Self-pay | Admitting: *Deleted

## 2019-12-03 DIAGNOSIS — Z203 Contact with and (suspected) exposure to rabies: Secondary | ICD-10-CM | POA: Diagnosis not present

## 2019-12-03 NOTE — Telephone Encounter (Signed)
Notified/sent letter to pt address.

## 2019-12-05 ENCOUNTER — Encounter: Payer: Self-pay | Admitting: Internal Medicine

## 2019-12-05 DIAGNOSIS — S40812A Abrasion of left upper arm, initial encounter: Secondary | ICD-10-CM | POA: Insufficient documentation

## 2019-12-05 NOTE — Assessment & Plan Note (Signed)
stable overall by history and exam, recent data reviewed with pt, and pt to continue medical treatment as before,  to f/u any worsening symptoms or concerns  

## 2019-12-05 NOTE — Assessment & Plan Note (Signed)
Mild to mod, no need for rabies vaccine, letter done,  to f/u any worsening symptoms or concerns

## 2019-12-05 NOTE — Patient Instructions (Signed)
Letter to be sent  Please continue all other medications as before, and refills have been done if requested.  Please have the pharmacy call with any other refills you may need.  Please continue your efforts at being more active, low cholesterol diet, and weight control.  Please keep your appointments with your specialists as you may have planned

## 2019-12-06 ENCOUNTER — Other Ambulatory Visit: Payer: Self-pay

## 2019-12-06 ENCOUNTER — Emergency Department
Admission: EM | Admit: 2019-12-06 | Discharge: 2019-12-06 | Disposition: A | Discharge status: Home / Self Care | Payer: BC Managed Care – PPO | Source: Home / Self Care

## 2019-12-06 ENCOUNTER — Encounter: Payer: Self-pay | Admitting: Emergency Medicine

## 2019-12-06 DIAGNOSIS — Z203 Contact with and (suspected) exposure to rabies: Secondary | ICD-10-CM

## 2019-12-06 MED ORDER — RABIES VACCINE, PCEC IM SUSR
1.0000 mL | Freq: Once | INTRAMUSCULAR | Status: AC
  Administered 2019-12-06: 1 mL via INTRAMUSCULAR

## 2019-12-06 NOTE — ED Triage Notes (Signed)
Patient here for rabivert # 2; first dose administered [facility] 12/ 30/20 without incident.

## 2019-12-10 ENCOUNTER — Encounter: Payer: Self-pay | Admitting: Emergency Medicine

## 2019-12-10 ENCOUNTER — Other Ambulatory Visit: Payer: Self-pay

## 2019-12-10 ENCOUNTER — Emergency Department (INDEPENDENT_AMBULATORY_CARE_PROVIDER_SITE_OTHER)
Admission: EM | Admit: 2019-12-10 | Discharge: 2019-12-10 | Disposition: A | Discharge status: Home / Self Care | Payer: BC Managed Care – PPO | Source: Home / Self Care

## 2019-12-10 DIAGNOSIS — Z23 Encounter for immunization: Secondary | ICD-10-CM | POA: Diagnosis not present

## 2019-12-10 NOTE — ED Triage Notes (Signed)
Rabies injection #3

## 2019-12-13 ENCOUNTER — Other Ambulatory Visit: Payer: Self-pay

## 2019-12-13 ENCOUNTER — Emergency Department (INDEPENDENT_AMBULATORY_CARE_PROVIDER_SITE_OTHER)
Admission: EM | Admit: 2019-12-13 | Discharge: 2019-12-13 | Disposition: A | Discharge status: Home / Self Care | Payer: BC Managed Care – PPO | Source: Home / Self Care

## 2019-12-13 ENCOUNTER — Encounter: Payer: Self-pay | Admitting: Emergency Medicine

## 2019-12-13 DIAGNOSIS — Z203 Contact with and (suspected) exposure to rabies: Secondary | ICD-10-CM

## 2019-12-13 MED ORDER — RABIES VACCINE, PCEC IM SUSR
1.0000 mL | Freq: Once | INTRAMUSCULAR | Status: AC
  Administered 2019-12-13: 1 mL via INTRAMUSCULAR

## 2019-12-13 NOTE — ED Triage Notes (Signed)
Patient here for final rabies vaccination. She has not had any adverse reaction to previous injections in this series.

## 2019-12-29 ENCOUNTER — Ambulatory Visit (INDEPENDENT_AMBULATORY_CARE_PROVIDER_SITE_OTHER): Payer: BC Managed Care – PPO | Admitting: Obstetrics & Gynecology

## 2019-12-29 ENCOUNTER — Other Ambulatory Visit: Payer: Self-pay

## 2019-12-29 ENCOUNTER — Encounter: Payer: Self-pay | Admitting: Obstetrics & Gynecology

## 2019-12-29 VITALS — BP 124/71 | HR 76 | Temp 98.5°F | Resp 16 | Ht 65.5 in | Wt 169.0 lb

## 2019-12-29 DIAGNOSIS — Z01419 Encounter for gynecological examination (general) (routine) without abnormal findings: Secondary | ICD-10-CM

## 2019-12-29 DIAGNOSIS — Z1151 Encounter for screening for human papillomavirus (HPV): Secondary | ICD-10-CM | POA: Diagnosis not present

## 2019-12-29 DIAGNOSIS — Z780 Asymptomatic menopausal state: Secondary | ICD-10-CM

## 2019-12-29 DIAGNOSIS — Z124 Encounter for screening for malignant neoplasm of cervix: Secondary | ICD-10-CM

## 2019-12-29 NOTE — Progress Notes (Signed)
Subjective:     Jasmine Palmer is a 64 y.o. female here for a routine exam.  Current complaints: none.    Gynecologic History No LMP recorded. Patient is postmenopausal. Contraception: post menopausal status Last Pap: 2011. Results were: normal Last mammogram: 2019. Results were: normal  Obstetric History OB History  Gravida Para Term Preterm AB Living  3 2     1 2   SAB TAB Ectopic Multiple Live Births  1            # Outcome Date GA Lbr Len/2nd Weight Sex Delivery Anes PTL Lv  3 SAB           2 Para           1 Para              The following portions of the patient's history were reviewed and updated as appropriate: allergies, current medications, past family history, past medical history, past social history, past surgical history and problem list.  Review of Systems Pertinent items noted in HPI and remainder of comprehensive ROS otherwise negative.    Objective:      Vitals:   12/29/19 1422  BP: 124/71  Pulse: 76  Resp: 16  Temp: 98.5 F (36.9 C)  Weight: 169 lb (76.7 kg)  Height: 5' 5.5" (1.664 m)   Vitals:  WNL General appearance: alert, cooperative and no distress  HEENT: Normocephalic, without obvious abnormality, atraumatic Eyes: negative Throat: lips, mucosa, and tongue normal; teeth and gums normal  Respiratory: Clear to auscultation bilaterally  CV: Regular rate and rhythm  Breasts:  Normal appearance, no masses or tenderness, no nipple retraction or dimpling  GI: Soft, non-tender; bowel sounds normal; no masses,  no organomegaly  GU: External Genitalia:  Tanner V, no lesion Urethra:  No prolapse   Vagina: Pale pink, no blood or discharge  Cervix: No CMT, no lesion  Uterus:  Normal size and contour, non tender, limited by habitus  Adnexa: Normal, no masses, non tender, limited by habitus  Musculoskeletal: No edema, redness or tenderness in the calves or thighs  Skin: No lesions or rash  Lymphatic: Axillary adenopathy: none     Psychiatric:  Normal mood and behavior   Assessment:    Healthy female exam.    Plan:   1.  Mammogram 2.  Pap and co testing 3.  Dexa ordered; daily calcium/vit D through diet or supplements 4.  Pt aware she needs colonoscopy.  PCP has ordered.

## 2019-12-29 NOTE — Patient Instructions (Signed)
Preventing Osteoporosis, Adult Osteoporosis is a condition that causes the bones to lose density. This means that the bones become thinner, and the normal spaces in bone tissue become larger. Low bone density can make the bones weak and cause them to break more easily. Osteoporosis cannot always be prevented, but you can take steps to lower your risk of developing this condition. How can this condition affect me? If you develop osteoporosis, you will be more likely to break bones in your wrist, spine, or hip. Even a minor accident or injury can be enough to break weak bones. The bones will also be slower to heal. Osteoporosis can cause other problems as well, such as a stooped posture or trouble with movement. Osteoporosis can occur with aging. As you get older, you may lose bone tissue more quickly, or it may be replaced more slowly. Osteoporosis is more likely to develop if you have poor nutrition or do not get enough calcium or vitamin D. Other lifestyle factors can also play a role. By eating a well-balanced diet and making lifestyle changes, you can help keep your bones strong and healthy, lowering your chances of developing osteoporosis. What can increase my risk? The following factors may make you more likely to develop osteoporosis:  Having a family history of the condition.  Having poor nutrition or not getting enough calcium or vitamin D.  Using certain medicines, such as steroid medicines or antiseizure medicines.  Being any of the following: ? 50 years of age or older. ? Female. ? A woman who has gone through menopause (is postmenopausal). ? White (Caucasian) or of Asian descent.  Smoking or having a history of smoking.  Not being physically active (being sedentary).  Having a small body frame. What actions can I take to prevent this?  Get enough calcium   Make sure you get enough calcium every day. Calcium is the most important mineral for bone health. Most people can get  enough calcium from their diet, but supplements may be recommended for people who are at risk for osteoporosis. Follow these guidelines: ? If you are age 50 or younger, aim to get 1,000 mg of calcium every day. ? If you are older than age 50, aim to get 1,200 mg of calcium every day.  Good sources of calcium include: ? Dairy products, such as low-fat or nonfat milk, cheese, and yogurt. ? Dark green leafy vegetables, such as bok choy and broccoli. ? Foods that have had calcium added to them (calcium-fortified foods), such as orange juice, cereal, bread, soy beverages, and tofu products. ? Nuts, such as almonds.  Check nutrition labels to see how much calcium is in a food or drink. Get enough vitamin D  Try to get enough vitamin D every day. Vitamin D is the most essential vitamin for bone health. It helps the body absorb calcium. Follow these guidelines for how much vitamin D to get from food: ? If you are age 70 or younger, aim to get at least 600 international units (IU) every day. Your health care provider may suggest more. ? If you are older than age 70, aim to get at least 800 international units every day. Your health care provider may suggest more.  Good sources of vitamin D in your diet include: ? Egg yolks. ? Oily fish, such as salmon, sardines, and tuna. ? Milk and cereal fortified with vitamin D.  Your body also makes vitamin D when you are out in the sun. Exposing the bare   skin on your face, arms, legs, or back to the sun for no more than 30 minutes a day, 2 times a week is more than enough. Beyond that, make sure you use sunblock to protect your skin from sunburn, which increases your risk for skin cancer. Exercise  Stay active and get exercise every day.  Ask your health care provider what types of exercise are best for you. Weight-bearing and strength-building activities are important for building and maintaining healthy bones. Some examples of these types of activities  include: ? Walking and hiking. ? Jogging and running. ? Dancing. ? Gym exercises. ? Lifting weights. ? Tennis and racquetball. ? Climbing stairs. ? Aerobics. Make other lifestyle changes  Do not use any products that contain nicotine or tobacco, such as cigarettes, e-cigarettes, and chewing tobacco. If you need help quitting, ask your health care provider.  Lose weight if you are overweight.  If you drink alcohol: ? Limit how much you use to:  0-1 drink a day for nonpregnant women.  0-2 drinks a day for men. ? Be aware of how much alcohol is in your drink. In the [place] one drink equals one 12 oz bottle of beer (355 mL), one 5 oz glass of wine (148 mL), or one 1 oz glass of hard liquor (44 mL). Where to find support If you need help making changes to prevent osteoporosis, talk with your health care provider. You can ask for a referral to a diet and nutrition specialist (dietitian) and a physical therapist. Where to find more information Learn more about osteoporosis from:  NIH Osteoporosis and Related Bone Diseases National Resource Center: www.bones.nih.gov  [place] Office on Women's Health: www.womenshealth.gov  National Osteoporosis Foundation: www.nof.org Summary  Osteoporosis is a condition that causes weak bones that are more likely to break.  Eat a healthy diet, making sure you get enough calcium and vitamin D, and stay active by getting regular exercise to help prevent osteoporosis.  Other ways to reduce your risk of osteoporosis include maintaining a healthy weight and avoiding alcohol and products that contain nicotine or tobacco. This information is not intended to replace advice given to you by your health care provider. Make sure you discuss any questions you have with your health care provider. Document Revised: 06/20/2019 Document Reviewed: 06/20/2019 Elsevier Patient Education  2020 Elsevier Inc.  

## 2020-01-01 LAB — CYTOLOGY - PAP
Comment: NEGATIVE
Diagnosis: NEGATIVE
High risk HPV: NEGATIVE

## 2020-01-08 ENCOUNTER — Encounter: Payer: Self-pay | Admitting: Internal Medicine

## 2020-01-08 DIAGNOSIS — Z03818 Encounter for observation for suspected exposure to other biological agents ruled out: Secondary | ICD-10-CM | POA: Diagnosis not present

## 2020-01-08 DIAGNOSIS — Z20828 Contact with and (suspected) exposure to other viral communicable diseases: Secondary | ICD-10-CM | POA: Diagnosis not present

## 2020-01-08 DIAGNOSIS — U071 COVID-19: Secondary | ICD-10-CM | POA: Diagnosis not present

## 2020-05-06 ENCOUNTER — Encounter: Payer: Self-pay | Admitting: Internal Medicine

## 2020-05-10 MED ORDER — ROSUVASTATIN CALCIUM 20 MG PO TABS: 20.0000 mg | ORAL_TABLET | Freq: Every day | ORAL | 0 refills | Status: DC

## 2020-06-10 ENCOUNTER — Telehealth: Payer: Self-pay | Admitting: Internal Medicine

## 2020-06-10 NOTE — Telephone Encounter (Signed)
   Patient requesting order  for annual labs 

## 2020-06-11 ENCOUNTER — Other Ambulatory Visit (INDEPENDENT_AMBULATORY_CARE_PROVIDER_SITE_OTHER): Payer: Managed Care, Other (non HMO)

## 2020-06-11 ENCOUNTER — Encounter: Payer: Self-pay | Admitting: Internal Medicine

## 2020-06-11 DIAGNOSIS — Z0000 Encounter for general adult medical examination without abnormal findings: Secondary | ICD-10-CM

## 2020-06-11 LAB — LIPID PANEL
Cholesterol: 166 mg/dL (ref 0–200)
HDL: 66.1 mg/dL (ref >39.00)
NonHDL: 99.95
Total CHOL/HDL Ratio: 3
Triglycerides: 208 mg/dL — ABNORMAL HIGH (ref 0.0–149.0)
VLDL: 41.6 mg/dL — ABNORMAL HIGH (ref 0.0–40.0)

## 2020-06-11 LAB — URINALYSIS, ROUTINE W REFLEX MICROSCOPIC
Bilirubin Urine: NEGATIVE
Ketones, ur: NEGATIVE
Nitrite: NEGATIVE
Specific Gravity, Urine: 1.015 (ref 1.000–1.030)
Total Protein, Urine: NEGATIVE
Urine Glucose: NEGATIVE
Urobilinogen, UA: 0.2 (ref 0.0–1.0)
pH: 6 (ref 5.0–8.0)

## 2020-06-11 LAB — BASIC METABOLIC PANEL
BUN: 13 mg/dL (ref 6–23)
CO2: 29 mEq/L (ref 19–32)
Calcium: 9.6 mg/dL (ref 8.4–10.5)
Chloride: 103 mEq/L (ref 96–112)
Creatinine, Ser: 1.08 mg/dL (ref 0.40–1.20)
GFR: 50.94 mL/min — ABNORMAL LOW (ref >60.00)
Glucose, Bld: 99 mg/dL (ref 70–99)
Potassium: 4.7 mEq/L (ref 3.5–5.1)
Sodium: 139 mEq/L (ref 135–145)

## 2020-06-11 LAB — HEPATIC FUNCTION PANEL
ALT: 19 U/L (ref 0–35)
AST: 19 U/L (ref 0–37)
Albumin: 4.4 g/dL (ref 3.5–5.2)
Alkaline Phosphatase: 70 U/L (ref 39–117)
Bilirubin, Direct: 0.3 mg/dL (ref 0.0–0.3)
Total Bilirubin: 1.4 mg/dL — ABNORMAL HIGH (ref 0.2–1.2)
Total Protein: 7 g/dL (ref 6.0–8.3)

## 2020-06-11 LAB — CBC WITH DIFFERENTIAL/PLATELET
Basophils Absolute: 0.1 10*3/uL (ref 0.0–0.1)
Basophils Relative: 1.1 % (ref 0.0–3.0)
Eosinophils Absolute: 0.7 10*3/uL (ref 0.0–0.7)
Eosinophils Relative: 6.2 % — ABNORMAL HIGH (ref 0.0–5.0)
HCT: 41.9 % (ref 36.0–46.0)
Hemoglobin: 14.6 g/dL (ref 12.0–15.0)
Lymphocytes Relative: 45.7 % (ref 12.0–46.0)
Lymphs Abs: 4.8 10*3/uL — ABNORMAL HIGH (ref 0.7–4.0)
MCHC: 34.9 g/dL (ref 30.0–36.0)
MCV: 88.9 fl (ref 78.0–100.0)
Monocytes Absolute: 0.8 10*3/uL (ref 0.1–1.0)
Monocytes Relative: 7.9 % (ref 3.0–12.0)
Neutro Abs: 4.1 10*3/uL (ref 1.4–7.7)
Neutrophils Relative %: 39.1 % — ABNORMAL LOW (ref 43.0–77.0)
Platelets: 260 10*3/uL (ref 150.0–400.0)
RBC: 4.71 Mil/uL (ref 3.87–5.11)
RDW: 13.2 % (ref 11.5–15.5)
WBC: 10.5 10*3/uL (ref 4.0–10.5)

## 2020-06-11 LAB — TSH: TSH: 5.36 u[IU]/mL — ABNORMAL HIGH (ref 0.35–4.50)

## 2020-06-11 LAB — LDL CHOLESTEROL, DIRECT: Direct LDL: 69 mg/dL

## 2020-06-11 NOTE — Telephone Encounter (Signed)
Ok labs are in 

## 2020-06-11 NOTE — Addendum Note (Signed)
Addended by: Waldemar Dickens B on: 06/11/2020 10:22 AM   Modules accepted: Orders

## 2020-06-11 NOTE — Telephone Encounter (Signed)
Pt is currently at the [facility] lab.   Last CPE with PCP - AVS stated to have labs done 3-5 days prior to next CPE. Pt is scheduled for 7-13. Lab orders are not in.   Okay to order labs?

## 2020-06-11 NOTE — Telephone Encounter (Signed)
Per pcp, labs are in. [facility] informed of same.

## 2020-06-15 ENCOUNTER — Other Ambulatory Visit: Payer: Self-pay

## 2020-06-15 ENCOUNTER — Ambulatory Visit (INDEPENDENT_AMBULATORY_CARE_PROVIDER_SITE_OTHER): Payer: Managed Care, Other (non HMO) | Admitting: Internal Medicine

## 2020-06-15 ENCOUNTER — Encounter: Payer: Self-pay | Admitting: Internal Medicine

## 2020-06-15 VITALS — BP 130/80 | HR 76 | Temp 98.7°F | Ht 65.5 in | Wt 167.0 lb

## 2020-06-15 DIAGNOSIS — Z0000 Encounter for general adult medical examination without abnormal findings: Secondary | ICD-10-CM | POA: Diagnosis not present

## 2020-06-15 DIAGNOSIS — R3129 Other microscopic hematuria: Secondary | ICD-10-CM | POA: Insufficient documentation

## 2020-06-15 DIAGNOSIS — Z0001 Encounter for general adult medical examination with abnormal findings: Secondary | ICD-10-CM | POA: Insufficient documentation

## 2020-06-15 NOTE — Progress Notes (Signed)
Subjective:    Patient ID: Jasmine Palmer, female    DOB: [birth year], 64 y.o.   MRN: [id number]  HPI  Here for wellness and f/u;  Overall doing ok;  Pt denies Chest pain, worsening SOB, DOE, wheezing, orthopnea, PND, worsening LE edema, palpitations, dizziness or syncope.  Pt denies neurological change such as new headache, facial or extremity weakness.  Pt denies polydipsia, polyuria, or low sugar symptoms. Pt states overall good compliance with treatment and medications, good tolerability, and has been trying to follow appropriate diet.  Pt denies worsening depressive symptoms, suicidal ideation or panic. No fever, night sweats, wt loss, loss of appetite, or other constitutional symptoms.  Pt states good ability with ADL's, has low fall risk, home safety reviewed and adequate, no other significant changes in hearing or vision, and only occasionally active with exercise. No new complaints Denies urinary symptoms such as dysuria, frequency, urgency, flank pain, hematuria or n/v, fever, chills.  Past Medical History:  Diagnosis Date  . ALLERGIC RHINITIS 10/09/2007  . Anxiety state, unspecified 10/10/2007  . DEPRESSION, MAJOR 09/20/2009  . HYPERLIPIDEMIA 10/10/2007  . HYPOTHYROIDISM 10/10/2007  . Hypothyroidism 12/22/2011  . INSOMNIA-SLEEP DISORDER-UNSPEC 09/27/2009  . OBESITY, HX OF 10/09/2007  . OSTEOPENIA 11/25/2010   History reviewed. No pertinent surgical history.  reports that she has been smoking cigarettes. She has a 10.00 pack-year smoking history. She has never used smokeless tobacco. She reports that she does not drink alcohol and does not use drugs. family history includes Colon cancer in her mother; Heart disease in her father; Hypertension in her father; Lung cancer in her sister and sister. Allergies  Allergen Reactions  . Ciprofloxacin Other (See Comments)    GI upset only  . Fexofenadine   . Pravastatin Sodium     REACTION: myalgia   Current Outpatient Medications on File Prior  to Visit  Medication Sig Dispense Refill  . albuterol (PROVENTIL HFA;VENTOLIN HFA) 108 (90 Base) MCG/ACT inhaler Inhale 2 puffs into the lungs every 6 (six) hours as needed for wheezing or shortness of breath. 1 Inhaler 0  . famotidine (PEPCID) 40 MG tablet Take 1 tablet by mouth once daily 30 tablet 5  . fluticasone (FLONASE) 50 MCG/ACT nasal spray Place 2 sprays into both nostrils daily. 16 g 6  . levothyroxine (SYNTHROID, LEVOTHROID) 25 MCG tablet Take 1 tablet (25 mcg total) by mouth daily before breakfast. 90 tablet 3  . rosuvastatin (CRESTOR) 20 MG tablet Take 1 tablet (20 mg total) by mouth daily. 90 tablet 0   No current facility-administered medications on file prior to visit.   Review of Systems All otherwise neg per pt    Objective:   Physical Exam BP 130/80 (BP Location: Left Arm, Patient Position: Sitting, Cuff Size: Large)   Pulse 76   Temp 98.7 F (37.1 C) (Oral)   Ht 5' 5.5" (1.664 m)   Wt 167 lb (75.8 kg)   SpO2 95%   BMI 27.37 kg/m  VS noted,  Constitutional: Pt appears in NAD HENT: Head: NCAT.  Right Ear: External ear normal.  Left Ear: External ear normal.  Eyes: . Pupils are equal, round, and reactive to light. Conjunctivae and EOM are normal Nose: without d/c or deformity Neck: Neck supple. Gross normal ROM Cardiovascular: Normal rate and regular rhythm.   Pulmonary/Chest: Effort normal and breath sounds without rales or wheezing.  Abd:  Soft, NT, ND, + BS, no organomegaly Neurological: Pt is alert. At baseline orientation, motor grossly intact Skin: Skin  is warm. No rashes, other new lesions, no LE edema Psychiatric: Pt behavior is normal without agitation  All otherwise neg per pt Lab Results  Component Value Date   WBC 10.5 06/11/2020   HGB 14.6 06/11/2020   HCT 41.9 06/11/2020   PLT 260.0 06/11/2020   GLUCOSE 99 06/11/2020   CHOL 166 06/11/2020   TRIG 208.0 (H) 06/11/2020   HDL 66.10 06/11/2020   LDLDIRECT 69.0 06/11/2020   LDLCALC 93  04/30/2009   ALT 19 06/11/2020   AST 19 06/11/2020   NA 139 06/11/2020   K 4.7 06/11/2020   CL 103 06/11/2020   CREATININE 1.08 06/11/2020   BUN 13 06/11/2020   CO2 29 06/11/2020   TSH 5.36 (H) 06/11/2020   HGBA1C 5.7 03/12/2017      Assessment & Plan:

## 2020-06-15 NOTE — Assessment & Plan Note (Signed)
For urology referral 

## 2020-06-15 NOTE — Assessment & Plan Note (Signed)

## 2020-06-15 NOTE — Patient Instructions (Addendum)
Please check with your insurance about the Cologuard coverage and let us know.  Please continue all other medications as before, and refills have been done if requested.  Please have the pharmacy call with any other refills you may need.  Please continue your efforts at being more active, low cholesterol diet, and weight control.  You are otherwise up to date with prevention measures today.  Please keep your appointments with your specialists as you may have planned  You will be contacted regarding the referral for: urology  Please make an Appointment to return for your 1 year visit, or sooner if needed, with Lab testing by Appointment as well, to be done about 3-5 days before at the FIRST FLOOR Lab (so this is for TWO appointments - please see the scheduling desk as you leave)

## 2020-06-16 ENCOUNTER — Encounter: Payer: Self-pay | Admitting: Internal Medicine

## 2020-09-13 ENCOUNTER — Encounter: Payer: Self-pay | Admitting: Internal Medicine

## 2020-09-13 MED ORDER — LEVOTHYROXINE SODIUM 25 MCG PO TABS: 25.0000 ug | ORAL_TABLET | Freq: Every day | ORAL | 2 refills | Status: DC

## 2021-01-04 ENCOUNTER — Telehealth: Payer: Self-pay | Admitting: Internal Medicine

## 2021-01-04 ENCOUNTER — Encounter: Payer: Self-pay | Admitting: Internal Medicine

## 2021-01-04 MED ORDER — ROSUVASTATIN CALCIUM 20 MG PO TABS: 20.0000 mg | ORAL_TABLET | Freq: Every day | ORAL | 0 refills | Status: DC

## 2021-01-04 NOTE — Telephone Encounter (Signed)
Please refill as per office routine med refill policy (all routine meds refilled for 3 mo or monthly per pt preference up to one year from last visit, then month to month grace period for 3 mo, then further med refills will have to be denied)  

## 2021-01-05 ENCOUNTER — Other Ambulatory Visit: Payer: Self-pay

## 2021-01-05 MED ORDER — PANTOPRAZOLE SODIUM 40 MG PO TBEC: 40.0000 mg | DELAYED_RELEASE_TABLET | Freq: Every day | ORAL | 1 refills | Status: DC

## 2021-04-22 LAB — HM MAMMOGRAPHY

## 2021-06-06 ENCOUNTER — Telehealth: Payer: Managed Care, Other (non HMO) | Admitting: Nurse Practitioner

## 2021-06-06 DIAGNOSIS — J0140 Acute pansinusitis, unspecified: Secondary | ICD-10-CM

## 2021-06-06 DIAGNOSIS — J40 Bronchitis, not specified as acute or chronic: Secondary | ICD-10-CM

## 2021-06-06 MED ORDER — AMOXICILLIN-POT CLAVULANATE 875-125 MG PO TABS: 1.0000 | ORAL_TABLET | Freq: Two times a day (BID) | ORAL | 0 refills | Status: AC

## 2021-06-06 NOTE — Progress Notes (Addendum)
Jasmine Palmer, Jasmine Palmer are scheduled for a virtual visit with your provider today.    Just as we do with appointments in the office, we must obtain your consent to participate.  Your consent will be active for this visit and any virtual visit you may have with one of our providers in the next 365 days.    If you have a MyChart account, I can also send a copy of this consent to you electronically.  All virtual visits are billed to your insurance company just like a traditional visit in the office.  As this is a virtual visit, video technology does not allow for your provider to perform a traditional examination.  This may limit your provider's ability to fully assess your condition.  If your provider identifies any concerns that need to be evaluated in person or the need to arrange testing such as labs, EKG, etc, we will make arrangements to do so.    Although advances in technology are sophisticated, we cannot ensure that it will always work on either your end or our end.  If the connection with a video visit is poor, we may have to switch to a telephone visit.  With either a video or telephone visit, we are not always able to ensure that we have a secure connection.   I need to obtain your verbal consent now.   Are you willing to proceed with your visit today?   Vinita Hatchell has provided verbal consent on 06/06/2021 for a virtual visit (video or telephone).   Viviano Simas, FNP 06/06/2021  8:12 AM    Virtual Visit via Video   I connected with patient on 06/06/21 at  8:30 AM EDT by a video enabled telemedicine application and verified that I am speaking with the correct person using two identifiers.  Location patient: Home Location provider: [facility] Persons participating in the virtual visit: Patient, Provider  I discussed the limitations of evaluation and management by telemedicine and the availability of in person appointments. The patient expressed understanding and agreed to  proceed.  Subjective:   HPI:   Patient presents via [facility] today with complaints of an ongoing cough for about 2 weeks. She was [facility] through her work [facility]/03/2021 and was given three day course of prednisone and an Albuterol inhaler for her cough.   She finished the prednisone yesterday and has continued to use the inhaler. She has had a cough and wheezing for the past 2 weeks. She has had ongoing fatigue associated with her cough and then late last week she started having more sinus congestion and her cough became productive at that time. She then noted wheezing late last week which prompted her visit to the clinic at her work.    She notes that she gets bronchitis about every summer but denies a history of asthma or COPD. She does have a history of smoking.   ROS:   See pertinent positives and negatives per HPI.  Patient Active Problem List   Diagnosis Date Noted   Preventative health care 06/15/2020   Microhematuria 06/15/2020   Right-sided face pain 05/21/2019   Gastroesophageal reflux disease 09/17/2018   Tobacco abuse 09/17/2018   Wheezing 10/11/2017   Degenerative arthritis of right knee 04/09/2017   Elevated systolic blood pressure reading without diagnosis of hypertension 03/21/2017   Right knee pain 03/13/2017   Rabies exposure 12/22/2011   OSTEOPENIA 11/25/2010   INSOMNIA-SLEEP DISORDER-UNSPEC 09/27/2009   DEPRESSION, MAJOR 09/20/2009  MENOPAUSAL DISORDER 05/11/2009   Hypothyroidism 10/10/2007   Hyperlipidemia 10/10/2007   Anxiety state 10/10/2007   Allergic rhinitis 10/09/2007    Social History   Tobacco Use   Smoking status: Every Day    Packs/day: 0.50    Years: 20.00    Pack years: 10.00    Types: Cigarettes   Smokeless tobacco: Never  Substance Use Topics   Alcohol use: No   Current Outpatient Medications  Medication Instructions   albuterol (PROVENTIL HFA;VENTOLIN HFA) 108 (90 Base) MCG/ACT inhaler 2 puffs, Inhalation, Every 6  hours PRN   famotidine (PEPCID) 40 MG tablet Take 1 tablet by mouth once daily   levothyroxine (SYNTHROID) 25 mcg, Oral, Daily before breakfast   pantoprazole (PROTONIX) 40 mg, Oral, Daily   rosuvastatin (CRESTOR) 20 mg, Oral, Daily     Allergies  Allergen Reactions   Ciprofloxacin Other (See Comments)    GI upset only   Fexofenadine    Pravastatin Sodium     REACTION: myalgia    Objective:   There were no vitals taken for this visit.  Patient is well-developed, well-nourished in no acute distress.  Resting comfortably at home.  Head is normocephalic, atraumatic.  No labored breathing.  Speech is clear and coherent with logical content.  Patient is alert and oriented at baseline.    Assessment and Plan:   Discussed with patient that a history of smoking and recurrent bronchitis may be an early sign of COPD and encouraged her to discuss this episode with her primary care physician at her next appointment.   Will move forward with bronchitis and sinusitis management today.   She is encouraged to seek follow up care if symptoms do not improve or with any worsening symptoms.   Meds ordered this encounter  Medications   amoxicillin-clavulanate (AUGMENTIN) 875-125 MG tablet    Sig: Take 1 tablet by mouth 2 (two) times daily for 10 days. Take with food    Dispense:  20 tablet    Refill:  0    I spent approximately 15 minutes on video consultation with patient today reviewing history and coordinating her care   Viviano Simas, FNP 06/06/2021

## 2021-06-17 ENCOUNTER — Encounter: Payer: Self-pay | Admitting: Internal Medicine

## 2021-06-17 ENCOUNTER — Encounter: Payer: Managed Care, Other (non HMO) | Admitting: Internal Medicine

## 2021-07-04 ENCOUNTER — Encounter: Payer: Self-pay | Admitting: Internal Medicine

## 2021-07-26 ENCOUNTER — Other Ambulatory Visit: Payer: Self-pay | Admitting: Internal Medicine

## 2021-07-26 NOTE — Telephone Encounter (Signed)
Please refill as per office routine med refill policy (all routine meds refilled for 3 mo or monthly per pt preference up to one year from last visit, then month to month grace period for 3 mo, then further med refills will have to be denied)  

## 2021-08-11 ENCOUNTER — Encounter: Payer: Self-pay | Admitting: Internal Medicine

## 2021-08-11 ENCOUNTER — Other Ambulatory Visit: Payer: Self-pay | Admitting: Internal Medicine

## 2021-08-11 NOTE — Telephone Encounter (Signed)
Please refill as per office routine med refill policy (all routine meds to be refilled for 3 mo or monthly (per pt preference) up to one year from last visit, then month to month grace period for 3 mo, then further med refills will have to be denied) ? ?

## 2021-08-15 ENCOUNTER — Encounter: Payer: Self-pay | Admitting: Internal Medicine

## 2021-08-15 DIAGNOSIS — E538 Deficiency of other specified B group vitamins: Secondary | ICD-10-CM

## 2021-08-15 DIAGNOSIS — Z0000 Encounter for general adult medical examination without abnormal findings: Secondary | ICD-10-CM

## 2021-08-15 DIAGNOSIS — R5383 Other fatigue: Secondary | ICD-10-CM

## 2021-08-15 DIAGNOSIS — E559 Vitamin D deficiency, unspecified: Secondary | ICD-10-CM

## 2021-08-17 ENCOUNTER — Ambulatory Visit: Payer: Managed Care, Other (non HMO) | Admitting: Internal Medicine

## 2021-08-22 ENCOUNTER — Other Ambulatory Visit (INDEPENDENT_AMBULATORY_CARE_PROVIDER_SITE_OTHER): Payer: Managed Care, Other (non HMO)

## 2021-08-22 DIAGNOSIS — R5383 Other fatigue: Secondary | ICD-10-CM | POA: Diagnosis not present

## 2021-08-22 DIAGNOSIS — E559 Vitamin D deficiency, unspecified: Secondary | ICD-10-CM | POA: Diagnosis not present

## 2021-08-22 DIAGNOSIS — Z0000 Encounter for general adult medical examination without abnormal findings: Secondary | ICD-10-CM | POA: Diagnosis not present

## 2021-08-22 DIAGNOSIS — E538 Deficiency of other specified B group vitamins: Secondary | ICD-10-CM | POA: Diagnosis not present

## 2021-08-22 LAB — LIPID PANEL
Cholesterol: 171 mg/dL (ref 0–200)
HDL: 67.2 mg/dL (ref >39.00)
LDL Cholesterol: 69 mg/dL (ref 0–99)
NonHDL: 103.51
Total CHOL/HDL Ratio: 3
Triglycerides: 174 mg/dL — ABNORMAL HIGH (ref 0.0–149.0)
VLDL: 34.8 mg/dL (ref 0.0–40.0)

## 2021-08-22 LAB — URINALYSIS, ROUTINE W REFLEX MICROSCOPIC
Bilirubin Urine: NEGATIVE
Ketones, ur: NEGATIVE
Nitrite: NEGATIVE
Specific Gravity, Urine: 1.025 (ref 1.000–1.030)
Total Protein, Urine: NEGATIVE
Urine Glucose: NEGATIVE
Urobilinogen, UA: 0.2 (ref 0.0–1.0)
pH: 6 (ref 5.0–8.0)

## 2021-08-22 LAB — CBC WITH DIFFERENTIAL/PLATELET
Basophils Absolute: 0.1 10*3/uL (ref 0.0–0.1)
Basophils Relative: 1 % (ref 0.0–3.0)
Eosinophils Absolute: 0.5 10*3/uL (ref 0.0–0.7)
Eosinophils Relative: 4.9 % (ref 0.0–5.0)
HCT: 40.8 % (ref 36.0–46.0)
Hemoglobin: 14 g/dL (ref 12.0–15.0)
Lymphocytes Relative: 47.4 % — ABNORMAL HIGH (ref 12.0–46.0)
Lymphs Abs: 4.5 10*3/uL — ABNORMAL HIGH (ref 0.7–4.0)
MCHC: 34.2 g/dL (ref 30.0–36.0)
MCV: 89.6 fl (ref 78.0–100.0)
Monocytes Absolute: 0.9 10*3/uL (ref 0.1–1.0)
Monocytes Relative: 9.9 % (ref 3.0–12.0)
Neutro Abs: 3.5 10*3/uL (ref 1.4–7.7)
Neutrophils Relative %: 36.8 % — ABNORMAL LOW (ref 43.0–77.0)
Platelets: 265 10*3/uL (ref 150.0–400.0)
RBC: 4.55 Mil/uL (ref 3.87–5.11)
RDW: 12.9 % (ref 11.5–15.5)
WBC: 9.4 10*3/uL (ref 4.0–10.5)

## 2021-08-22 LAB — BASIC METABOLIC PANEL
BUN: 11 mg/dL (ref 6–23)
CO2: 27 mEq/L (ref 19–32)
Calcium: 9.3 mg/dL (ref 8.4–10.5)
Chloride: 104 mEq/L (ref 96–112)
Creatinine, Ser: 1.07 mg/dL (ref 0.40–1.20)
GFR: 54.3 mL/min — ABNORMAL LOW (ref >60.00)
Glucose, Bld: 95 mg/dL (ref 70–99)
Potassium: 4.2 mEq/L (ref 3.5–5.1)
Sodium: 140 mEq/L (ref 135–145)

## 2021-08-22 LAB — HEPATIC FUNCTION PANEL
ALT: 18 U/L (ref 0–35)
AST: 18 U/L (ref 0–37)
Albumin: 4.3 g/dL (ref 3.5–5.2)
Alkaline Phosphatase: 66 U/L (ref 39–117)
Bilirubin, Direct: 0.2 mg/dL (ref 0.0–0.3)
Total Bilirubin: 1.6 mg/dL — ABNORMAL HIGH (ref 0.2–1.2)
Total Protein: 7.1 g/dL (ref 6.0–8.3)

## 2021-08-22 LAB — TSH: TSH: 4.55 u[IU]/mL (ref 0.35–5.50)

## 2021-08-22 LAB — VITAMIN B12: Vitamin B-12: 179 pg/mL — ABNORMAL LOW (ref 211–911)

## 2021-08-22 LAB — VITAMIN D 25 HYDROXY (VIT D DEFICIENCY, FRACTURES): VITD: 11.41 ng/mL — ABNORMAL LOW (ref 30.00–100.00)

## 2021-08-22 LAB — T4, FREE: Free T4: 0.74 ng/dL (ref 0.60–1.60)

## 2021-08-24 ENCOUNTER — Encounter: Payer: Self-pay | Admitting: Internal Medicine

## 2021-08-24 ENCOUNTER — Other Ambulatory Visit: Payer: Self-pay

## 2021-08-24 ENCOUNTER — Ambulatory Visit (INDEPENDENT_AMBULATORY_CARE_PROVIDER_SITE_OTHER): Payer: Managed Care, Other (non HMO) | Admitting: Internal Medicine

## 2021-08-24 VITALS — BP 122/70 | HR 64 | Temp 98.6°F | Ht 65.5 in | Wt 176.0 lb

## 2021-08-24 DIAGNOSIS — E039 Hypothyroidism, unspecified: Secondary | ICD-10-CM | POA: Diagnosis not present

## 2021-08-24 DIAGNOSIS — Z0001 Encounter for general adult medical examination with abnormal findings: Secondary | ICD-10-CM | POA: Diagnosis not present

## 2021-08-24 DIAGNOSIS — E559 Vitamin D deficiency, unspecified: Secondary | ICD-10-CM | POA: Diagnosis not present

## 2021-08-24 DIAGNOSIS — E7800 Pure hypercholesterolemia, unspecified: Secondary | ICD-10-CM | POA: Diagnosis not present

## 2021-08-24 DIAGNOSIS — F329 Major depressive disorder, single episode, unspecified: Secondary | ICD-10-CM

## 2021-08-24 DIAGNOSIS — E538 Deficiency of other specified B group vitamins: Secondary | ICD-10-CM | POA: Diagnosis not present

## 2021-08-24 DIAGNOSIS — R739 Hyperglycemia, unspecified: Secondary | ICD-10-CM | POA: Diagnosis not present

## 2021-08-24 DIAGNOSIS — Z1211 Encounter for screening for malignant neoplasm of colon: Secondary | ICD-10-CM | POA: Diagnosis not present

## 2021-08-24 MED ORDER — PANTOPRAZOLE SODIUM 40 MG PO TBEC: 40.0000 mg | DELAYED_RELEASE_TABLET | Freq: Every day | ORAL | 3 refills | Status: DC

## 2021-08-24 MED ORDER — ROSUVASTATIN CALCIUM 20 MG PO TABS: 20.0000 mg | ORAL_TABLET | Freq: Every day | ORAL | 3 refills | Status: DC

## 2021-08-24 MED ORDER — LEVOTHYROXINE SODIUM 25 MCG PO TABS: 25.0000 ug | ORAL_TABLET | Freq: Every day | ORAL | 3 refills | Status: DC

## 2021-08-24 NOTE — Patient Instructions (Addendum)
You will be contacted regarding the referral for: cologuard  Please call if you would want the bone density testing  Please take OTC Vitamin D3 at 2000 units per day, indefinitely  Please also take B12 1000 mcg per day for about 6 months  Please continue all other medications as before, and refills have been done if requested.  Please have the pharmacy call with any other refills you may need.  Please continue your efforts at being more active, low cholesterol diet, and weight control.  You are otherwise up to date with prevention measures today.  Please keep your appointments with your specialists as you may have planned  Please make an Appointment to return for your 1 year visit, or sooner if needed, with Lab testing by Appointment as well, to be done about 3-5 days before at the FIRST FLOOR Lab (so this is for TWO appointments - please see the scheduling desk as you leave)  Due to the ongoing Covid 19 pandemic, our lab now requires an appointment for any labs done at our office.  If you need labs done and do not have an appointment, please call our office ahead of time to schedule before presenting to the lab for your testing.

## 2021-08-24 NOTE — Progress Notes (Signed)
Patient ID: Jasmine Palmer, female   DOB: 12-13-1955, 64 y.o.   MRN: [id number]         Chief Complaint:: wellness exam and low vit b12 and Vit D, hld, hypothyroidism       HPI:  Jasmine Palmer is a 65 y.o. female here for wellness exam; declines colnoscopy but ok for cologuard, declines covid booster, shingrix, flu shot, Tdap, dxa, ow up to date with preventive referral and immunizations                        Also has gained several lbs recently but staying active at home and work.  May retire next yr.  Had covid infection probably twice. Pt denies chest pain, increased sob or doe, wheezing, orthopnea, PND, increased LE swelling, palpitations, dizziness or syncope.   Pt denies polydipsia, polyuria, or new focal neuro s/s.   Pt denies fever, wt loss, night sweats, loss of appetite, or other constitutional symptoms   Denies hyper or hypo thyroid symptoms such as voice, skin or hair change.  Trying to follow lower chol diet. Denies worsening depressive symptoms, suicidal ideation, or panic; has ongoing anxiety.     Wt Readings from Last 3 Encounters:  08/24/21 176 lb (79.8 kg)  06/15/20 167 lb (75.8 kg)  12/29/19 169 lb (76.7 kg)   BP Readings from Last 3 Encounters:  08/24/21 122/70  06/15/20 130/80  12/29/19 124/71   Immunization History  Administered Date(s) Administered   Moderna Sars-Covid-2 Vaccination 01/04/2020, 01/26/2020, 11/05/2020   Rabies, IM 12/03/2019, 12/06/2019, 12/13/2019   Td 12/05/1995   There are no preventive care reminders to display for this patient.     Past Medical History:  Diagnosis Date   ALLERGIC RHINITIS 10/09/2007   Anxiety state, unspecified 10/10/2007   DEPRESSION, MAJOR 09/20/2009   HYPERLIPIDEMIA 10/10/2007   HYPOTHYROIDISM 10/10/2007   Hypothyroidism 12/22/2011   INSOMNIA-SLEEP DISORDER-UNSPEC 09/27/2009   OBESITY, HX OF 10/09/2007   OSTEOPENIA 11/25/2010   History reviewed. No pertinent surgical history.  reports that she has been smoking  cigarettes. She has a 10.00 pack-year smoking history. She has never used smokeless tobacco. She reports that she does not drink alcohol and does not use drugs. family history includes Colon cancer in her mother; Heart disease in her father; Hypertension in her father; Lung cancer in her sister and sister. Allergies  Allergen Reactions   Ciprofloxacin Other (See Comments)    GI upset only   Fexofenadine    Pravastatin Sodium     REACTION: myalgia   Current Outpatient Medications on File Prior to Visit  Medication Sig Dispense Refill   albuterol (PROVENTIL HFA;VENTOLIN HFA) 108 (90 Base) MCG/ACT inhaler Inhale 2 puffs into the lungs every 6 (six) hours as needed for wheezing or shortness of breath. 1 Inhaler 0   famotidine (PEPCID) 40 MG tablet Take 1 tablet by mouth once daily 30 tablet 5   No current facility-administered medications on file prior to visit.        ROS:  All others reviewed and negative.  Objective        PE:  BP 122/70 (BP Location: Right Arm, Patient Position: Sitting, Cuff Size: Normal)   Pulse 64   Temp 98.6 F (37 C) (Oral)   Ht 5' 5.5" (1.664 m)   Wt 176 lb (79.8 kg)   SpO2 97%   BMI 28.84 kg/m  Constitutional: Pt appears in NAD               HENT: Head: NCAT.                Right Ear: External ear normal.                 Left Ear: External ear normal.                Eyes: . Pupils are equal, round, and reactive to light. Conjunctivae and EOM are normal               Nose: without d/c or deformity               Neck: Neck supple. Gross normal ROM               Cardiovascular: Normal rate and regular rhythm.                 Pulmonary/Chest: Effort normal and breath sounds without rales or wheezing.                Abd:  Soft, NT, ND, + BS, no organomegaly               Neurological: Pt is alert. At baseline orientation, motor grossly intact               Skin: Skin is warm. No rashes, no other new lesions, LE edema - none                Psychiatric: Pt behavior is normal without agitation   Micro: none  Cardiac tracings I have personally interpreted today:  none  Pertinent Radiological findings (summarize): none   Lab Results  Component Value Date   WBC 9.4 08/22/2021   HGB 14.0 08/22/2021   HCT 40.8 08/22/2021   PLT 265.0 08/22/2021   GLUCOSE 95 08/22/2021   CHOL 171 08/22/2021   TRIG 174.0 (H) 08/22/2021   HDL 67.20 08/22/2021   LDLDIRECT 69.0 06/11/2020   LDLCALC 69 08/22/2021   ALT 18 08/22/2021   AST 18 08/22/2021   NA 140 08/22/2021   K 4.2 08/22/2021   CL 104 08/22/2021   CREATININE 1.07 08/22/2021   BUN 11 08/22/2021   CO2 27 08/22/2021   TSH 4.55 08/22/2021   HGBA1C 5.7 03/12/2017   Assessment/Plan:  Jasmine Palmer is a 65 y.o. White or Caucasian [1] female with  has a past medical history of ALLERGIC RHINITIS (10/09/2007), Anxiety state, unspecified (10/10/2007), DEPRESSION, MAJOR (09/20/2009), HYPERLIPIDEMIA (10/10/2007), HYPOTHYROIDISM (10/10/2007), Hypothyroidism (12/22/2011), INSOMNIA-SLEEP DISORDER-UNSPEC (09/27/2009), OBESITY, HX OF (10/09/2007), and OSTEOPENIA (11/25/2010).  Encounter for well adult exam with abnormal findings Age and sex appropriate education and counseling updated with regular exercise and diet Referrals for preventative services - declines colonoscopy, ok for cologuard Immunizations addressed - declines covid booster, shingrix, flu shot, Tdap Smoking counseling  - none needed Evidence for depression or other mood disorder - anxiety depression overall stable Most recent labs reviewed. I have personally reviewed and have noted: 1) the patient's medical and social history 2) The patient's current medications and supplements 3) The patient's height, weight, and BMI have been recorded in the chart   Vitamin D deficiency Last vitamin D Lab Results  Component Value Date   VD25OH 11.41 (L) 08/22/2021   Low, to start oral replacement   Hypothyroidism Lab Results   Component Value Date   TSH 4.55 08/22/2021   Stable, pt to continue  levothyroxine   Hyperlipidemia Lab Results  Component Value Date   LDLCALC 69 08/22/2021   Stable, pt to continue current statin crestor 20   DEPRESSION, MAJOR Stable overall, declines need for change in tx or counsling  B12 deficiency Lab Results  Component Value Date   VITAMINB12 179 (L) 08/22/2021   Low, to start oral replacement - b12 1000 mcg qd  Followup: Return in about 1 year (around 08/24/2022).  Oliver Barre, MD 08/27/2021 8:26 PM [facility]

## 2021-08-27 ENCOUNTER — Encounter: Payer: Self-pay | Admitting: Internal Medicine

## 2021-08-27 DIAGNOSIS — E559 Vitamin D deficiency, unspecified: Secondary | ICD-10-CM | POA: Insufficient documentation

## 2021-08-27 DIAGNOSIS — E538 Deficiency of other specified B group vitamins: Secondary | ICD-10-CM | POA: Insufficient documentation

## 2021-08-27 NOTE — Assessment & Plan Note (Signed)
Lab Results  Component Value Date   VITAMINB12 179 (L) 08/22/2021   Low, to start oral replacement - b12 1000 mcg qd

## 2021-08-27 NOTE — Assessment & Plan Note (Signed)
Lab Results  Component Value Date   LDLCALC 69 08/22/2021   Stable, pt to continue current statin crestor 20

## 2021-08-27 NOTE — Assessment & Plan Note (Signed)
Stable overall, declines need for change in tx or counsling

## 2021-08-27 NOTE — Assessment & Plan Note (Signed)
Age and sex appropriate education and counseling updated with regular exercise and diet Referrals for preventative services - declines colonoscopy, ok for cologuard Immunizations addressed - declines covid booster, shingrix, flu shot, Tdap Smoking counseling  - none needed Evidence for depression or other mood disorder - anxiety depression overall stable Most recent labs reviewed. I have personally reviewed and have noted: 1) the patient's medical and social history 2) The patient's current medications and supplements 3) The patient's height, weight, and BMI have been recorded in the chart

## 2021-08-27 NOTE — Assessment & Plan Note (Signed)
Lab Results  Component Value Date   TSH 4.55 08/22/2021   Stable, pt to continue levothyroxine

## 2021-08-27 NOTE — Assessment & Plan Note (Signed)
Last vitamin D Lab Results  Component Value Date   VD25OH 11.41 (L) 08/22/2021   Low, to start oral replacement

## 2021-08-29 ENCOUNTER — Encounter: Payer: Self-pay | Admitting: Internal Medicine

## 2021-08-29 DIAGNOSIS — N1831 Chronic kidney disease, stage 3a: Secondary | ICD-10-CM

## 2021-08-30 ENCOUNTER — Encounter: Payer: Self-pay | Admitting: Internal Medicine

## 2021-09-05 LAB — COLOGUARD: Cologuard: POSITIVE — AB

## 2021-09-06 ENCOUNTER — Other Ambulatory Visit: Payer: Self-pay | Admitting: Internal Medicine

## 2021-09-06 ENCOUNTER — Encounter: Payer: Self-pay | Admitting: Internal Medicine

## 2021-09-06 DIAGNOSIS — R195 Other fecal abnormalities: Secondary | ICD-10-CM

## 2021-10-25 DIAGNOSIS — N1831 Chronic kidney disease, stage 3a: Secondary | ICD-10-CM | POA: Insufficient documentation

## 2022-01-20 ENCOUNTER — Telehealth: Payer: Managed Care, Other (non HMO) | Admitting: Emergency Medicine

## 2022-01-20 DIAGNOSIS — B028 Zoster with other complications: Secondary | ICD-10-CM | POA: Diagnosis not present

## 2022-01-20 MED ORDER — GABAPENTIN 100 MG PO CAPS: 100.0000 mg | ORAL_CAPSULE | Freq: Three times a day (TID) | ORAL | 0 refills | Status: DC

## 2022-01-20 NOTE — Progress Notes (Signed)
Virtual Visit Consent   Jasmine Palmer, you are scheduled for a virtual visit with a [facility] provider today.     Just as with appointments in the office, your consent must be obtained to participate.  Your consent will be active for this visit and any virtual visit you may have with one of our providers in the next 365 days.     If you have a MyChart account, a copy of this consent can be sent to you electronically.  All virtual visits are billed to your insurance company just like a traditional visit in the office.    As this is a virtual visit, video technology does not allow for your provider to perform a traditional examination.  This may limit your provider's ability to fully assess your condition.  If your provider identifies any concerns that need to be evaluated in person or the need to arrange testing (such as labs, EKG, etc.), we will make arrangements to do so.     Although advances in technology are sophisticated, we cannot ensure that it will always work on either your end or our end.  If the connection with a video visit is poor, the visit may have to be switched to a telephone visit.  With either a video or telephone visit, we are not always able to ensure that we have a secure connection.     I need to obtain your verbal consent now.   Are you willing to proceed with your visit today? Yes   Jasmine Palmer has provided verbal consent on 01/20/2022 for a virtual visit (video or telephone).   Rennis Harding, [place]   Date: 01/20/2022 4:15 PM   Virtual Visit via Video Note   I, Rennis Harding, connected with  Jasmine Palmer  ([id number], 1956/06/17) on 01/20/22 at  4:15 PM EST by a video-enabled telemedicine application and verified that I am speaking with the correct person using two identifiers.  Location: Patient: Virtual Visit Location Patient: Home Provider: Virtual Visit Location Provider: Home Office   I discussed the limitations of evaluation and management by  telemedicine and the availability of in person appointments. The patient expressed understanding and agreed to proceed.    History of Present Illness: Jasmine Palmer is a 66 y.o. who identifies as a female who was assigned female at birth, and is being seen today for "shingles pain" over LT back, underarm, and breast area x 3 weeks.  Initially was seen at [facility] and treated with muscle relaxer and steroids without relief.  Rash appeared shortly after, followed up at [facility] and was prescribed valtrex.  Reports some improvement with valtrex, but continued pain.  Complains of tightness and as if arm is stapled to side of body.  Patient states pain is unbearable.  Denies previous hx of shingles.  Denies fever, nausea, vomiting, drainage, bleeding.    HPI: HPI  Problems:  Patient Active Problem List   Diagnosis Date Noted   Vitamin D deficiency 08/27/2021   B12 deficiency 08/27/2021   Encounter for well adult exam with abnormal findings 06/15/2020   Microhematuria 06/15/2020   Right-sided face pain 05/21/2019   Gastroesophageal reflux disease 09/17/2018   Tobacco abuse 09/17/2018   Wheezing 10/11/2017   Degenerative arthritis of right knee 04/09/2017   Elevated systolic blood pressure reading without diagnosis of hypertension 03/21/2017   Right knee pain 03/13/2017   Rabies exposure 12/22/2011   OSTEOPENIA 11/25/2010   INSOMNIA-SLEEP DISORDER-UNSPEC 09/27/2009   DEPRESSION, MAJOR 09/20/2009   MENOPAUSAL  DISORDER 05/11/2009   Hypothyroidism 10/10/2007   Hyperlipidemia 10/10/2007   Anxiety state 10/10/2007   Allergic rhinitis 10/09/2007    Allergies:  Allergies  Allergen Reactions   Ciprofloxacin Other (See Comments)    GI upset only   Fexofenadine    Pravastatin Sodium     REACTION: myalgia   Medications:  Current Outpatient Medications:    gabapentin (NEURONTIN) 100 MG capsule, Take 1 capsule (100 mg total) by mouth 3 (three) times daily., Disp: 30 capsule, Rfl: 0   albuterol (PROVENTIL  HFA;VENTOLIN HFA) 108 (90 Base) MCG/ACT inhaler, Inhale 2 puffs into the lungs every 6 (six) hours as needed for wheezing or shortness of breath., Disp: 1 Inhaler, Rfl: 0   famotidine (PEPCID) 40 MG tablet, Take 1 tablet by mouth once daily, Disp: 30 tablet, Rfl: 5   levothyroxine (EUTHYROX) 25 MCG tablet, Take 1 tablet (25 mcg total) by mouth daily before breakfast., Disp: 90 tablet, Rfl: 3   pantoprazole (PROTONIX) 40 MG tablet, Take 1 tablet (40 mg total) by mouth daily., Disp: 90 tablet, Rfl: 3   rosuvastatin (CRESTOR) 20 MG tablet, Take 1 tablet (20 mg total) by mouth daily., Disp: 90 tablet, Rfl: 3  Observations/Objective: Patient is well-developed, well-nourished in no acute distress. Appears uncomfortable Resting comfortably  at home.  Head is normocephalic, atraumatic.  No labored breathing. Speaking in full sentences Speech is clear and coherent with logical content.  Patient is alert and oriented at baseline.    Assessment and Plan: 1. Herpes zoster with other complication  Rest and use ice/heat as needed for symptomatic relief Continue with valtrex Gabapentin prescribed.  Follow up with PCP ASAP for recheck and to ensure symptoms are improving - and you may need your medication adjusted Use OTC medications such as ibuprofen/ tylenol.   Follow up in person at urgent care or go to ER if you have any new or worsening symptoms (such as eye involvement, severe pain, or signs of secondary infection such as fever, chills, nausea, vomiting, discharge, redness or warmth over site of rash)   Follow Up Instructions: I discussed the assessment and treatment plan with the patient. The patient was provided an opportunity to ask questions and all were answered. The patient agreed with the plan and demonstrated an understanding of the instructions.  A copy of instructions were sent to the patient via MyChart unless otherwise noted below.    The patient was advised to call back or seek an  in-person evaluation if the symptoms worsen or if the condition fails to improve as anticipated.  Time:  I spent 5-10 minutes with the patient via telehealth technology discussing the above problems/concerns.    Lestine Box, PA-C

## 2022-01-20 NOTE — Patient Instructions (Signed)
°  Valla Leaver, thank you for joining [place], PA-C for today's virtual visit.  While this provider is not your primary care provider (PCP), if your PCP is located in our provider database this encounter information will be shared with them immediately following your visit.  Consent: (Patient) Jasmine Palmer provided verbal consent for this virtual visit at the beginning of the encounter.  Current Medications:  Current Outpatient Medications:    gabapentin (NEURONTIN) 100 MG capsule, Take 1 capsule (100 mg total) by mouth 3 (three) times daily., Disp: 30 capsule, Rfl: 0   albuterol (PROVENTIL HFA;VENTOLIN HFA) 108 (90 Base) MCG/ACT inhaler, Inhale 2 puffs into the lungs every 6 (six) hours as needed for wheezing or shortness of breath., Disp: 1 Inhaler, Rfl: 0   famotidine (PEPCID) 40 MG tablet, Take 1 tablet by mouth once daily, Disp: 30 tablet, Rfl: 5   levothyroxine (EUTHYROX) 25 MCG tablet, Take 1 tablet (25 mcg total) by mouth daily before breakfast., Disp: 90 tablet, Rfl: 3   pantoprazole (PROTONIX) 40 MG tablet, Take 1 tablet (40 mg total) by mouth daily., Disp: 90 tablet, Rfl: 3   rosuvastatin (CRESTOR) 20 MG tablet, Take 1 tablet (20 mg total) by mouth daily., Disp: 90 tablet, Rfl: 3   Medications ordered in this encounter:  Meds ordered this encounter  Medications   gabapentin (NEURONTIN) 100 MG capsule    Sig: Take 1 capsule (100 mg total) by mouth 3 (three) times daily.    Dispense:  30 capsule    Refill:  0    Order Specific Question:   Supervising Provider    Answer:   Sabra Heck, [place]     *If you need refills on other medications prior to your next appointment, please contact your pharmacy*  Follow-Up: Call back or seek an in-person evaluation if the symptoms worsen or if the condition fails to improve as anticipated.  Other Instructions Rest and use ice/heat as needed for symptomatic relief Continue with valtrex Gabapentin prescribed.  Follow up with PCP  ASAP for recheck and to ensure symptoms are improving - and you may need your medication adjusted Use OTC medications such as ibuprofen/ tylenol.   Follow up in person at urgent care or go to ER if you have any new or worsening symptoms (such as eye involvement, severe pain, or signs of secondary infection such as fever, chills, nausea, vomiting, discharge, redness or warmth over site of rash)    If you have been instructed to have an in-person evaluation today at a local Urgent Care facility, please use the link below. It will take you to a list of all of our available [facility], including address, phone number and hours of operation. Please do not delay care.  [facility]  If you or a family member do not have a primary care provider, use the link below to schedule a visit and establish care. When you choose a [facility] primary care physician or advanced practice provider, you gain a long-term partner in health. Find a Primary Care Provider  Learn more about [facility]'s in-office and virtual care options: [facility]

## 2022-01-23 ENCOUNTER — Ambulatory Visit: Payer: Managed Care, Other (non HMO) | Admitting: Internal Medicine

## 2022-01-23 ENCOUNTER — Other Ambulatory Visit: Payer: Self-pay

## 2022-01-23 ENCOUNTER — Encounter: Payer: Self-pay | Admitting: Internal Medicine

## 2022-01-23 DIAGNOSIS — B0229 Other postherpetic nervous system involvement: Secondary | ICD-10-CM | POA: Diagnosis not present

## 2022-01-23 DIAGNOSIS — E559 Vitamin D deficiency, unspecified: Secondary | ICD-10-CM

## 2022-01-23 DIAGNOSIS — Z720 Tobacco use: Secondary | ICD-10-CM | POA: Diagnosis not present

## 2022-01-23 DIAGNOSIS — E538 Deficiency of other specified B group vitamins: Secondary | ICD-10-CM | POA: Diagnosis not present

## 2022-01-23 MED ORDER — HYDROCODONE-ACETAMINOPHEN 7.5-325 MG PO TABS: 1.0000 | ORAL_TABLET | Freq: Four times a day (QID) | ORAL | 0 refills | Status: DC | PRN

## 2022-01-23 MED ORDER — GABAPENTIN 300 MG PO CAPS: 300.0000 mg | ORAL_CAPSULE | Freq: Three times a day (TID) | ORAL | 5 refills | Status: DC

## 2022-01-23 NOTE — Progress Notes (Signed)
Patient ID: Jasmine Palmer, female   DOB: 1956-01-27, 66 y.o.   MRN: [id number]        Chief Complaint: follow up PHN       HPI:  Jasmine Palmer is a 66 y.o. female here with c/o uncontrolled neuritic pain post recent shingles outbreak now rash resolved; pain is primarily x 3 wks to neck, arm, and axilla; did have antiviral tx per UC at the outset, has not been able to work x 2 wks;  was later started on gabapentin 100 tid but not working well enough, but also tolerating well.  Has spent last 2 wks mostly in bed incapacitated, pain to start was 10/10, now 8/10, was not offered narcotic at UC b/c "they dont do that" per pt.  Topical lidocaine no help.  Needs work note, hoping to get back to work by feb 27, last worked feb 46.         Wt Readings from Last 3 Encounters:  01/23/22 176 lb (79.8 kg)  08/24/21 176 lb (79.8 kg)  06/15/20 167 lb (75.8 kg)   BP Readings from Last 3 Encounters:  01/23/22 122/78  08/24/21 122/70  06/15/20 130/80         Past Medical History:  Diagnosis Date   ALLERGIC RHINITIS 10/09/2007   Anxiety state, unspecified 10/10/2007   DEPRESSION, MAJOR 09/20/2009   HYPERLIPIDEMIA 10/10/2007   HYPOTHYROIDISM 10/10/2007   Hypothyroidism 12/22/2011   INSOMNIA-SLEEP DISORDER-UNSPEC 09/27/2009   OBESITY, HX OF 10/09/2007   OSTEOPENIA 11/25/2010   History reviewed. No pertinent surgical history.  reports that she has been smoking cigarettes. She has a 10.00 pack-year smoking history. She has never used smokeless tobacco. She reports that she does not drink alcohol and does not use drugs. family history includes Colon cancer in her mother; Heart disease in her father; Hypertension in her father; Lung cancer in her sister and sister. Allergies  Allergen Reactions   Ciprofloxacin Other (See Comments)    GI upset only   Fexofenadine    Pravastatin Sodium     REACTION: myalgia   Current Outpatient Medications on File Prior to Visit  Medication Sig Dispense Refill   albuterol  (PROVENTIL HFA;VENTOLIN HFA) 108 (90 Base) MCG/ACT inhaler Inhale 2 puffs into the lungs every 6 (six) hours as needed for wheezing or shortness of breath. 1 Inhaler 0   famotidine (PEPCID) 40 MG tablet Take 1 tablet by mouth once daily 30 tablet 5   levothyroxine (EUTHYROX) 25 MCG tablet Take 1 tablet (25 mcg total) by mouth daily before breakfast. 90 tablet 3   pantoprazole (PROTONIX) 40 MG tablet Take 1 tablet (40 mg total) by mouth daily. 90 tablet 3   rosuvastatin (CRESTOR) 20 MG tablet Take 1 tablet (20 mg total) by mouth daily. 90 tablet 3   No current facility-administered medications on file prior to visit.        ROS:  All others reviewed and negative.  Objective        PE:  BP 122/78 (BP Location: Right Arm, Patient Position: Sitting, Cuff Size: Large)    Pulse 84    Temp 98.7 F (37.1 C) (Oral)    Ht 5' 5.5" (1.664 m)    Wt 176 lb (79.8 kg)    SpO2 94%    BMI 28.84 kg/m                 Constitutional: Pt appears in NAD  HENT: Head: NCAT.                Right Ear: External ear normal.                 Left Ear: External ear normal.                Eyes: . Pupils are equal, round, and reactive to light. Conjunctivae and EOM are normal               Nose: without d/c or deformity               Neck: Neck supple. Gross normal ROM               Cardiovascular: Normal rate and regular rhythm.                 Pulmonary/Chest: Effort normal and breath sounds without rales or wheezing.                Abd:  Soft, NT, ND, + BS, no organomegaly               Neurological: Pt is alert. At baseline orientation, motor grossly intact               Skin: Skin is warm. No rashes, no other new lesions, LE edema - none               Psychiatric: Pt behavior is normal without agitation   Micro: none  Cardiac tracings I have personally interpreted today:  none  Pertinent Radiological findings (summarize): none   Lab Results  Component Value Date   WBC 9.4 08/22/2021   HGB 14.0  08/22/2021   HCT 40.8 08/22/2021   PLT 265.0 08/22/2021   GLUCOSE 95 08/22/2021   CHOL 171 08/22/2021   TRIG 174.0 (H) 08/22/2021   HDL 67.20 08/22/2021   LDLDIRECT 69.0 06/11/2020   LDLCALC 69 08/22/2021   ALT 18 08/22/2021   AST 18 08/22/2021   NA 140 08/22/2021   K 4.2 08/22/2021   CL 104 08/22/2021   CREATININE 1.07 08/22/2021   BUN 11 08/22/2021   CO2 27 08/22/2021   TSH 4.55 08/22/2021   HGBA1C 5.7 03/12/2017   Assessment/Plan:  Jasmine Palmer is a 66 y.o. White or Caucasian [1] female with  has a past medical history of ALLERGIC RHINITIS (10/09/2007), Anxiety state, unspecified (10/10/2007), DEPRESSION, MAJOR (09/20/2009), HYPERLIPIDEMIA (10/10/2007), HYPOTHYROIDISM (10/10/2007), Hypothyroidism (12/22/2011), INSOMNIA-SLEEP DISORDER-UNSPEC (09/27/2009), OBESITY, HX OF (10/09/2007), and OSTEOPENIA (11/25/2010).  PHN (postherpetic neuralgia) Mod to severe persistent, needs to get back to work - gave work note, for increased gabapentin to 200 tid for 3 days, then 300 tid, and vicodin prn,  to f/u any worsening symptoms or concerns  B12 deficiency Lab Results  Component Value Date   VITAMINB12 179 (L) 08/22/2021   Low, reminded to take oral replacement - b12 1000 mcg qd   Vitamin D deficiency Last vitamin D Lab Results  Component Value Date   VD25OH 11.41 (L) 08/22/2021   Low, reminded to start oral replacement   Tobacco abuse Pt counsled to quit, pt not ready  Followup: Return if symptoms worsen or fail to improve.  Cathlean Cower, MD 01/29/2022 7:07 PM [facility]

## 2022-01-23 NOTE — Patient Instructions (Signed)
Ok to increase the gabapentin that you have to the 200 mg x three time per day until finished, then:  Increase the gabapentin to 300 mg x three times daily  Please take all new medication as prescribed - the hydrocodone as needed  Please call in 1 wk if further hydrocodone is needed  You are given the work note  Please continue all other medications as before, and refills have been done if requested.  Please have the pharmacy call with any other refills you may need.  Please keep your appointments with your specialists as you may have planned

## 2022-01-25 ENCOUNTER — Encounter: Payer: Self-pay | Admitting: Internal Medicine

## 2022-01-25 MED ORDER — AZITHROMYCIN 250 MG PO TABS: ORAL_TABLET | ORAL | 1 refills | Status: AC

## 2022-01-25 NOTE — Telephone Encounter (Signed)
Please advise if another appt is needed or can an antibiotic be sent to the patient's pharmacy . Thank you

## 2022-01-29 ENCOUNTER — Encounter: Payer: Self-pay | Admitting: Internal Medicine

## 2022-01-29 NOTE — Assessment & Plan Note (Signed)
Last vitamin D Lab Results  Component Value Date   VD25OH 11.41 (L) 08/22/2021   Low, reminded to start oral replacement

## 2022-01-29 NOTE — Assessment & Plan Note (Signed)
Lab Results  Component Value Date   VITAMINB12 179 (L) 08/22/2021   Low, reminded to take oral replacement - b12 1000 mcg qd

## 2022-01-29 NOTE — Assessment & Plan Note (Signed)
Mod to severe persistent, needs to get back to work - gave work note, for increased gabapentin to 200 tid for 3 days, then 300 tid, and vicodin prn,  to f/u any worsening symptoms or concerns

## 2022-01-29 NOTE — Assessment & Plan Note (Signed)
Pt counsled to quit, pt not ready °

## 2022-01-31 MED ORDER — DEXTROMETHORPHAN-GUAIFENESIN 10-100 MG/5ML PO LIQD: 5.0000 mL | Freq: Four times a day (QID) | ORAL | 0 refills | Status: DC | PRN

## 2022-01-31 NOTE — Addendum Note (Signed)
Addended by: Biagio Borg on: 01/31/2022 07:21 PM   Modules accepted: Orders

## 2022-02-01 NOTE — Telephone Encounter (Signed)
I thought she wanted a non narcotic cough med - this was sent ?

## 2022-02-02 MED ORDER — TRAMADOL HCL 50 MG PO TABS: 50.0000 mg | ORAL_TABLET | Freq: Four times a day (QID) | ORAL | 0 refills | Status: DC | PRN

## 2022-02-02 NOTE — Telephone Encounter (Signed)
Ok I sent tramadol ?

## 2022-02-02 NOTE — Addendum Note (Signed)
Addended by: Corwin Levins on: 02/02/2022 04:57 PM ? ? Modules accepted: Orders ? ?

## 2022-02-09 ENCOUNTER — Encounter: Payer: Self-pay | Admitting: Internal Medicine

## 2022-02-09 MED ORDER — HYDROCODONE-ACETAMINOPHEN 7.5-300 MG PO TABS: ORAL_TABLET | ORAL | 0 refills | Status: DC

## 2022-02-09 MED ORDER — HYDROCODONE-ACETAMINOPHEN 7.5-325 MG PO TABS: 1.0000 | ORAL_TABLET | Freq: Four times a day (QID) | ORAL | 0 refills | Status: DC | PRN

## 2022-02-22 ENCOUNTER — Encounter: Payer: Self-pay | Admitting: Internal Medicine

## 2022-02-25 MED ORDER — LIDOCAINE 5 % EX PTCH: 1.0000 | MEDICATED_PATCH | CUTANEOUS | 2 refills | Status: DC

## 2022-03-09 ENCOUNTER — Ambulatory Visit: Payer: Managed Care, Other (non HMO) | Admitting: Internal Medicine

## 2022-03-09 ENCOUNTER — Encounter: Payer: Self-pay | Admitting: Internal Medicine

## 2022-03-09 VITALS — BP 122/68 | HR 76 | Temp 98.3°F | Ht 65.5 in | Wt 176.8 lb

## 2022-03-09 DIAGNOSIS — E559 Vitamin D deficiency, unspecified: Secondary | ICD-10-CM | POA: Diagnosis not present

## 2022-03-09 DIAGNOSIS — E039 Hypothyroidism, unspecified: Secondary | ICD-10-CM

## 2022-03-09 DIAGNOSIS — E538 Deficiency of other specified B group vitamins: Secondary | ICD-10-CM

## 2022-03-09 DIAGNOSIS — B0229 Other postherpetic nervous system involvement: Secondary | ICD-10-CM | POA: Diagnosis not present

## 2022-03-09 MED ORDER — PREDNISONE 10 MG PO TABS: ORAL_TABLET | ORAL | 0 refills | Status: DC

## 2022-03-09 MED ORDER — HYDROCODONE-ACETAMINOPHEN 7.5-325 MG PO TABS: 1.0000 | ORAL_TABLET | Freq: Four times a day (QID) | ORAL | 0 refills | Status: DC | PRN

## 2022-03-09 MED ORDER — METHYLPREDNISOLONE ACETATE 80 MG/ML IJ SUSP
80.0000 mg | Freq: Once | INTRAMUSCULAR | Status: AC
  Administered 2022-03-09: 80 mg via INTRAMUSCULAR

## 2022-03-09 NOTE — Patient Instructions (Signed)
You had the steroid shot today ? ?Please take all new medication as prescribed - the prednisone ? ?Please continue all other medications as before, and refills have been done if requested - hydrocodone ? ?Please have the pharmacy call with any other refills you may need. ? ?Please continue your efforts at being more active, low cholesterol diet, and weight control. ? ?You are otherwise up to date with prevention measures today. ? ?Please keep your appointments with your specialists as you may have planned - pain management in about 2 wks ? ? ?

## 2022-03-09 NOTE — Progress Notes (Signed)
Patient ID: Jasmine Palmer, female   DOB: [birth year], 66 y.o.   MRN: [id number] ? ? ? ?    Chief Complaint: follow up PHN ? ?     HPI:  Jasmine Palmer is a 66 y.o. female here with c/o persistent burning tender neuritic pain to left neck, upper back to shoulder and somewhat left upper chest/breast as well; same distribution as previous shingles feb 2023; and no other neck pain or radicular LUE pain, numbness or weakness.  Hoping for better pain control today; had a reaction to tramadol and will not take further, no nsaid candidate after seeing her nephrologist though her most recent GFR nov 2022 was > 60 in the [facility] system; has pain clinic appt in about 2 wks but hesitates to go; hoping for a repeat steroid trial today and qhs hydrocodone to use sparingly. Decines increased gabapentin with sleepiness   Pt denies chest pain, increased sob or doe, wheezing, orthopnea, PND, increased LE swelling, palpitations, dizziness or syncope.   Pt denies polydipsia, polyuria, or new focal neuro s/s.    Pt denies fever, wt loss, night sweats, loss of appetite, or other constitutional symptoms   Has been out of work for over 1 mo trying to get through this as pain is too intense.   Not taking b12 or Vit D.  Denies hyper or hypo thyroid symptoms such as voice, other skin or hair change. ?Wt Readings from Last 3 Encounters:  ?03/09/22 176 lb 12.8 oz (80.2 kg)  ?01/23/22 176 lb (79.8 kg)  ?08/24/21 176 lb (79.8 kg)  ? ?BP Readings from Last 3 Encounters:  ?03/09/22 122/68  ?01/23/22 122/78  ?08/24/21 122/70  ? ?      ?Past Medical History:  ?Diagnosis Date  ? ALLERGIC RHINITIS 10/09/2007  ? Anxiety state, unspecified 10/10/2007  ? DEPRESSION, MAJOR 09/20/2009  ? HYPERLIPIDEMIA 10/10/2007  ? HYPOTHYROIDISM 10/10/2007  ? Hypothyroidism 12/22/2011  ? INSOMNIA-SLEEP DISORDER-UNSPEC 09/27/2009  ? OBESITY, HX OF 10/09/2007  ? OSTEOPENIA 11/25/2010  ? ?History reviewed. No pertinent surgical history. ? reports that she has been smoking cigarettes.  She has a 10.00 pack-year smoking history. She has never used smokeless tobacco. She reports that she does not drink alcohol and does not use drugs. ?family history includes Colon cancer in her mother; Heart disease in her father; Hypertension in her father; Lung cancer in her sister and sister. ?Allergies  ?Allergen Reactions  ? Ciprofloxacin Other (See Comments)  ?  GI upset only  ? Fexofenadine   ? Pravastatin Sodium   ?  REACTION: myalgia  ? Tramadol Other (See Comments)  ?  Low blood pressrue  ? ?Current Outpatient Medications on File Prior to Visit  ?Medication Sig Dispense Refill  ? albuterol (PROVENTIL HFA;VENTOLIN HFA) 108 (90 Base) MCG/ACT inhaler Inhale 2 puffs into the lungs every 6 (six) hours as needed for wheezing or shortness of breath. 1 Inhaler 0  ? dextromethorphan-guaiFENesin (ROBITUSSIN-DM) 10-100 MG/5ML liquid Take 5 mLs by mouth every 6 (six) hours as needed for cough. 180 mL 0  ? famotidine (PEPCID) 40 MG tablet Take 1 tablet by mouth once daily 30 tablet 5  ? gabapentin (NEURONTIN) 300 MG capsule Take 1 capsule (300 mg total) by mouth 3 (three) times daily. 90 capsule 5  ? levothyroxine (EUTHYROX) 25 MCG tablet Take 1 tablet (25 mcg total) by mouth daily before breakfast. 90 tablet 3  ? lidocaine (LIDODERM) 5 % Place 1 patch onto the skin daily. Remove & Discard patch within  12 hours or as directed by MD 30 patch 2  ? pantoprazole (PROTONIX) 40 MG tablet Take 1 tablet (40 mg total) by mouth daily. 90 tablet 3  ? rosuvastatin (CRESTOR) 20 MG tablet Take 1 tablet (20 mg total) by mouth daily. 90 tablet 3  ? ?No current facility-administered medications on file prior to visit.  ? ?     ROS:  All others reviewed and negative. ? ?Objective  ? ?     PE:  BP 122/68 (BP Location: Right Arm, Patient Position: Sitting, Cuff Size: Large)   Pulse 76   Temp 98.3 ?F (36.8 ?C) (Oral)   Ht 5' 5.5" (1.664 m)   Wt 176 lb 12.8 oz (80.2 kg)   SpO2 98%   BMI 28.97 kg/m?  ? ?              Constitutional: Pt  appears in NAD ?              HENT: Head: NCAT.  ?              Right Ear: External ear normal.   ?              Left Ear: External ear normal.  ?              Eyes: . Pupils are equal, round, and reactive to light. Conjunctivae and EOM are normal ?              Nose: without d/c or deformity ?              Neck: Neck supple. Gross normal ROM ?              Cardiovascular: Normal rate and regular rhythm.   ?              Pulmonary/Chest: Effort normal and breath sounds without rales or wheezing.  ?              Abd:  Soft, NT, ND, + BS, no organomegaly ?              Neurological: Pt is alert. At baseline orientation, motor grossly intact ?              Skin: Skin is warm. No rashes, no other new lesions, LE edema - none ?              Psychiatric: Pt behavior is normal without agitation  ? ?Micro: none ? ?Cardiac tracings I have personally interpreted today:  none ? ?Pertinent Radiological findings (summarize): none  ? ?Lab Results  ?Component Value Date  ? WBC 9.4 08/22/2021  ? HGB 14.0 08/22/2021  ? HCT 40.8 08/22/2021  ? PLT 265.0 08/22/2021  ? GLUCOSE 95 08/22/2021  ? CHOL 171 08/22/2021  ? TRIG 174.0 (H) 08/22/2021  ? HDL 67.20 08/22/2021  ? LDLDIRECT 69.0 06/11/2020  ? [place] 69 08/22/2021  ? ALT 18 08/22/2021  ? AST 18 08/22/2021  ? NA 140 08/22/2021  ? K 4.2 08/22/2021  ? CL 104 08/22/2021  ? CREATININE 1.07 08/22/2021  ? BUN 11 08/22/2021  ? CO2 27 08/22/2021  ? TSH 4.55 08/22/2021  ? HGBA1C 5.7 03/12/2017  ? ?Assessment/Plan:  ?Jasmine Palmer is a 66 y.o. White or Caucasian [1] female with  has a past medical history of ALLERGIC RHINITIS (10/09/2007), Anxiety state, unspecified (10/10/2007), DEPRESSION, MAJOR (09/20/2009), HYPERLIPIDEMIA (10/10/2007), HYPOTHYROIDISM (10/10/2007), Hypothyroidism (12/22/2011), INSOMNIA-SLEEP DISORDER-UNSPEC (09/27/2009), OBESITY, HX OF (10/09/2007),  and OSTEOPENIA (11/25/2010). ? ?Vitamin D deficiency ?Last vitamin D ?Lab Results  ?Component Value Date  ? VD25OH 11.41 (L)  08/22/2021  ? ?Low, to start oral replacement ? ? ?B12 deficiency ?Lab Results  ?Component Value Date  ? VITAMINB12 179 (L) 08/22/2021  ? ?Low, to start oral replacement - b12 1000 mcg qd ? ? ?Hypothyroidism ?Lab Results  ?Component Value Date  ? TSH 4.55 08/22/2021  ? ?Stable, pt to continue levothyroxine ? ?PHN (postherpetic neuralgia) ?Uncontrolled and persistent, mod to occasinoally severe, for depomedrol im 80, predpac asd, and prn hydrocodone refill, to f/u pain clinic as planned ? ?Followup: Return if symptoms worsen or fail to improve. ? ?Cathlean Cower, MD 03/10/2022 5:52 PM ?[place] [facility] [facility] ?

## 2022-03-10 ENCOUNTER — Encounter: Payer: Self-pay | Admitting: Internal Medicine

## 2022-03-10 NOTE — Assessment & Plan Note (Signed)
Lab Results  Component Value Date   TSH 4.55 08/22/2021   Stable, pt to continue levothyroxine  

## 2022-03-10 NOTE — Assessment & Plan Note (Signed)
Lab Results  Component Value Date   VITAMINB12 179 (L) 08/22/2021   Low, to start oral replacement - b12 1000 mcg qd  

## 2022-03-10 NOTE — Assessment & Plan Note (Signed)
Uncontrolled and persistent, mod to occasinoally severe, for depomedrol im 80, predpac asd, and prn hydrocodone refill, to f/u pain clinic as planned ?

## 2022-03-10 NOTE — Assessment & Plan Note (Signed)
Last vitamin D Lab Results  Component Value Date   VD25OH 11.41 (L) 08/22/2021   Low, to start oral replacement  

## 2022-09-13 ENCOUNTER — Encounter: Payer: Self-pay | Admitting: Internal Medicine

## 2022-09-13 DIAGNOSIS — E538 Deficiency of other specified B group vitamins: Secondary | ICD-10-CM

## 2022-09-13 DIAGNOSIS — E559 Vitamin D deficiency, unspecified: Secondary | ICD-10-CM

## 2022-09-13 DIAGNOSIS — R739 Hyperglycemia, unspecified: Secondary | ICD-10-CM

## 2022-09-13 DIAGNOSIS — E7800 Pure hypercholesterolemia, unspecified: Secondary | ICD-10-CM

## 2022-09-13 NOTE — Telephone Encounter (Signed)
Patient would like to have labs done prior to upcoming visit.

## 2022-09-20 ENCOUNTER — Other Ambulatory Visit (INDEPENDENT_AMBULATORY_CARE_PROVIDER_SITE_OTHER): Payer: Managed Care, Other (non HMO)

## 2022-09-20 DIAGNOSIS — R739 Hyperglycemia, unspecified: Secondary | ICD-10-CM | POA: Diagnosis not present

## 2022-09-20 DIAGNOSIS — E559 Vitamin D deficiency, unspecified: Secondary | ICD-10-CM

## 2022-09-20 DIAGNOSIS — E7800 Pure hypercholesterolemia, unspecified: Secondary | ICD-10-CM | POA: Diagnosis not present

## 2022-09-20 DIAGNOSIS — E538 Deficiency of other specified B group vitamins: Secondary | ICD-10-CM

## 2022-09-20 LAB — VITAMIN D 25 HYDROXY (VIT D DEFICIENCY, FRACTURES): VITD: 13.82 ng/mL — ABNORMAL LOW (ref 30.00–100.00)

## 2022-09-20 LAB — HEPATIC FUNCTION PANEL
ALT: 19 U/L (ref 0–35)
AST: 19 U/L (ref 0–37)
Albumin: 4.2 g/dL (ref 3.5–5.2)
Alkaline Phosphatase: 71 U/L (ref 39–117)
Bilirubin, Direct: 0.2 mg/dL (ref 0.0–0.3)
Total Bilirubin: 1.2 mg/dL (ref 0.2–1.2)
Total Protein: 6.8 g/dL (ref 6.0–8.3)

## 2022-09-20 LAB — LIPID PANEL
Cholesterol: 259 mg/dL — ABNORMAL HIGH (ref 0–200)
HDL: 61.1 mg/dL (ref >39.00)
NonHDL: 197.5
Total CHOL/HDL Ratio: 4
Triglycerides: 343 mg/dL — ABNORMAL HIGH (ref 0.0–149.0)
VLDL: 68.6 mg/dL — ABNORMAL HIGH (ref 0.0–40.0)

## 2022-09-20 LAB — VITAMIN B12: Vitamin B-12: 486 pg/mL (ref 211–911)

## 2022-09-20 LAB — TSH: TSH: 4.7 u[IU]/mL (ref 0.35–5.50)

## 2022-09-20 LAB — CBC WITH DIFFERENTIAL/PLATELET
Basophils Absolute: 0.1 10*3/uL (ref 0.0–0.1)
Basophils Relative: 1.1 % (ref 0.0–3.0)
Eosinophils Absolute: 0.6 10*3/uL (ref 0.0–0.7)
Eosinophils Relative: 7 % — ABNORMAL HIGH (ref 0.0–5.0)
HCT: 42.6 % (ref 36.0–46.0)
Hemoglobin: 14.4 g/dL (ref 12.0–15.0)
Lymphocytes Relative: 46.8 % — ABNORMAL HIGH (ref 12.0–46.0)
Lymphs Abs: 4.3 10*3/uL — ABNORMAL HIGH (ref 0.7–4.0)
MCHC: 33.8 g/dL (ref 30.0–36.0)
MCV: 88.9 fl (ref 78.0–100.0)
Monocytes Absolute: 0.8 10*3/uL (ref 0.1–1.0)
Monocytes Relative: 8.6 % (ref 3.0–12.0)
Neutro Abs: 3.4 10*3/uL (ref 1.4–7.7)
Neutrophils Relative %: 36.5 % — ABNORMAL LOW (ref 43.0–77.0)
Platelets: 290 10*3/uL (ref 150.0–400.0)
RBC: 4.79 Mil/uL (ref 3.87–5.11)
RDW: 13.4 % (ref 11.5–15.5)
WBC: 9.2 10*3/uL (ref 4.0–10.5)

## 2022-09-20 LAB — URINALYSIS, ROUTINE W REFLEX MICROSCOPIC
Bilirubin Urine: NEGATIVE
Hgb urine dipstick: NEGATIVE
Ketones, ur: NEGATIVE
Leukocytes,Ua: NEGATIVE
Nitrite: NEGATIVE
Specific Gravity, Urine: 1.015 (ref 1.000–1.030)
Total Protein, Urine: NEGATIVE
Urine Glucose: NEGATIVE
Urobilinogen, UA: 0.2 (ref 0.0–1.0)
pH: 7.5 (ref 5.0–8.0)

## 2022-09-20 LAB — BASIC METABOLIC PANEL
BUN: 9 mg/dL (ref 6–23)
CO2: 28 mEq/L (ref 19–32)
Calcium: 9.3 mg/dL (ref 8.4–10.5)
Chloride: 104 mEq/L (ref 96–112)
Creatinine, Ser: 0.97 mg/dL (ref 0.40–1.20)
GFR: 60.63 mL/min (ref >60.00)
Glucose, Bld: 91 mg/dL (ref 70–99)
Potassium: 3.8 mEq/L (ref 3.5–5.1)
Sodium: 140 mEq/L (ref 135–145)

## 2022-09-20 LAB — MICROALBUMIN / CREATININE URINE RATIO
Creatinine,U: 70.7 mg/dL
Microalb Creat Ratio: 1 mg/g (ref 0.0–30.0)
Microalb, Ur: <0.7 mg/dL (ref 0.0–1.9)

## 2022-09-20 LAB — LDL CHOLESTEROL, DIRECT: Direct LDL: 160 mg/dL

## 2022-09-20 LAB — HEMOGLOBIN A1C: Hgb A1c MFr Bld: 6 % (ref 4.6–6.5)

## 2022-09-22 ENCOUNTER — Ambulatory Visit (INDEPENDENT_AMBULATORY_CARE_PROVIDER_SITE_OTHER): Payer: Managed Care, Other (non HMO) | Admitting: Internal Medicine

## 2022-09-22 VITALS — BP 128/76 | HR 68 | Temp 98.2°F | Ht 65.0 in | Wt 177.0 lb

## 2022-09-22 DIAGNOSIS — R9431 Abnormal electrocardiogram [ECG] [EKG]: Secondary | ICD-10-CM

## 2022-09-22 DIAGNOSIS — E538 Deficiency of other specified B group vitamins: Secondary | ICD-10-CM

## 2022-09-22 DIAGNOSIS — Z0001 Encounter for general adult medical examination with abnormal findings: Secondary | ICD-10-CM

## 2022-09-22 DIAGNOSIS — E559 Vitamin D deficiency, unspecified: Secondary | ICD-10-CM | POA: Diagnosis not present

## 2022-09-22 DIAGNOSIS — R739 Hyperglycemia, unspecified: Secondary | ICD-10-CM | POA: Diagnosis not present

## 2022-09-22 DIAGNOSIS — B0229 Other postherpetic nervous system involvement: Secondary | ICD-10-CM

## 2022-09-22 DIAGNOSIS — E7800 Pure hypercholesterolemia, unspecified: Secondary | ICD-10-CM | POA: Diagnosis not present

## 2022-09-22 DIAGNOSIS — Z720 Tobacco use: Secondary | ICD-10-CM

## 2022-09-22 DIAGNOSIS — E039 Hypothyroidism, unspecified: Secondary | ICD-10-CM

## 2022-09-22 MED ORDER — ROSUVASTATIN CALCIUM 20 MG PO TABS: 20.0000 mg | ORAL_TABLET | Freq: Every day | ORAL | 3 refills | Status: DC

## 2022-09-22 MED ORDER — PANTOPRAZOLE SODIUM 40 MG PO TBEC: 40.0000 mg | DELAYED_RELEASE_TABLET | Freq: Every day | ORAL | 3 refills | Status: DC

## 2022-09-22 MED ORDER — ALBUTEROL SULFATE HFA 108 (90 BASE) MCG/ACT IN AERS: 2.0000 | INHALATION_SPRAY | Freq: Four times a day (QID) | RESPIRATORY_TRACT | 11 refills | Status: DC | PRN

## 2022-09-22 MED ORDER — LEVOTHYROXINE SODIUM 25 MCG PO TABS: 25.0000 ug | ORAL_TABLET | Freq: Every day | ORAL | 3 refills | Status: DC

## 2022-09-22 NOTE — Progress Notes (Unsigned)
Patient ID: Jasmine Palmer, female   DOB: December 06, 1955, 66 y.o.   MRN: [id number]         Chief Complaint:: wellness exam and low vit d, phn, low thyroid, hld, b12 deficiency       HPI:  Jasmine Palmer is a 66 y.o. female here for wellness exam; declines dxa for now, declines all vaccinations, o/w up to date                        Also s/p colonoscopy 2022 oct, neg per pt.  PHN pain resolved and doing well.  Not taking statin, but willing to restart, and willing for cardiac CT score.  Not taking Vit D.  Pt denies chest pain, increased sob or doe, wheezing, orthopnea, PND, increased LE swelling, palpitations, dizziness or syncope.   Pt denies polydipsia, polyuria, or new focal neuro s/s.    Pt denies fever, wt loss, night sweats, loss of appetite, or other constitutional symptoms     Wt Readings from Last 3 Encounters:  09/22/22 177 lb (80.3 kg)  03/09/22 176 lb 12.8 oz (80.2 kg)  01/23/22 176 lb (79.8 kg)   BP Readings from Last 3 Encounters:  09/22/22 128/76  03/09/22 122/68  01/23/22 122/78   Immunization History  Administered Date(s) Administered   Moderna Sars-Covid-2 Vaccination 01/04/2020, 01/26/2020, 11/05/2020   Rabies, IM 12/03/2019, 12/06/2019, 12/13/2019   Td 12/05/1995   Health Maintenance Due  Topic Date Due   DEXA SCAN  Never done      Past Medical History:  Diagnosis Date   ALLERGIC RHINITIS 10/09/2007   Anxiety state, unspecified 10/10/2007   DEPRESSION, MAJOR 09/20/2009   HYPERLIPIDEMIA 10/10/2007   HYPOTHYROIDISM 10/10/2007   Hypothyroidism 12/22/2011   INSOMNIA-SLEEP DISORDER-UNSPEC 09/27/2009   OBESITY, HX OF 10/09/2007   OSTEOPENIA 11/25/2010   No past surgical history on file.  reports that she has been smoking cigarettes. She has a 10.00 pack-year smoking history. She has never used smokeless tobacco. She reports that she does not drink alcohol and does not use drugs. family history includes Colon cancer in her mother; Heart disease in her father;  Hypertension in her father; Lung cancer in her sister and sister. Allergies  Allergen Reactions   Ciprofloxacin Other (See Comments)    GI upset only   Fexofenadine    Pravastatin Sodium     REACTION: myalgia   Tramadol Other (See Comments)    Low blood pressrue   No current outpatient medications on file prior to visit.   No current facility-administered medications on file prior to visit.        ROS:  All others reviewed and negative.  Objective        PE:  BP 128/76 (BP Location: Left Arm, Patient Position: Sitting, Cuff Size: Large)   Pulse 68   Temp 98.2 F (36.8 C) (Oral)   Ht 5\' 5"  (1.651 m)   Wt 177 lb (80.3 kg)   SpO2 93%   BMI 29.45 kg/m                 Constitutional: Pt appears in NAD               HENT: Head: NCAT.                Right Ear: External ear normal.                 Left Ear: External ear normal.  Eyes: . Pupils are equal, round, and reactive to light. Conjunctivae and EOM are normal               Nose: without d/c or deformity               Neck: Neck supple. Gross normal ROM               Cardiovascular: Normal rate and regular rhythm.                 Pulmonary/Chest: Effort normal and breath sounds without rales or wheezing.                Abd:  Soft, NT, ND, + BS, no organomegaly               Neurological: Pt is alert. At baseline orientation, motor grossly intact               Skin: Skin is warm. No rashes, no other new lesions, LE edema - none               Psychiatric: Pt behavior is normal without agitation   Micro: none  Cardiac tracings I have personally interpreted today:  none  Pertinent Radiological findings (summarize): none   Lab Results  Component Value Date   WBC 9.2 09/20/2022   HGB 14.4 09/20/2022   HCT 42.6 09/20/2022   PLT 290.0 09/20/2022   GLUCOSE 91 09/20/2022   CHOL 259 (H) 09/20/2022   TRIG 343.0 (H) 09/20/2022   HDL 61.10 09/20/2022   LDLDIRECT 160.0 09/20/2022   LDLCALC 69 08/22/2021   ALT  19 09/20/2022   AST 19 09/20/2022   NA 140 09/20/2022   K 3.8 09/20/2022   CL 104 09/20/2022   CREATININE 0.97 09/20/2022   BUN 9 09/20/2022   CO2 28 09/20/2022   TSH 4.70 09/20/2022   HGBA1C 6.0 09/20/2022   MICROALBUR <0.7 09/20/2022   Assessment/Plan:  Jasmine Palmer is a 66 y.o. White or Caucasian [1] female with  has a past medical history of ALLERGIC RHINITIS (10/09/2007), Anxiety state, unspecified (10/10/2007), DEPRESSION, MAJOR (09/20/2009), HYPERLIPIDEMIA (10/10/2007), HYPOTHYROIDISM (10/10/2007), Hypothyroidism (12/22/2011), INSOMNIA-SLEEP DISORDER-UNSPEC (09/27/2009), OBESITY, HX OF (10/09/2007), and OSTEOPENIA (11/25/2010).  Encounter for well adult exam with abnormal findings Age and sex appropriate education and counseling updated with regular exercise and diet Referrals for preventative services - declines dxa Immunizations addressed - declines all Vax today Smoking counseling  - pt counsled to quit, pt not ready Evidence for depression or other mood disorder - none significant Most recent labs reviewed. I have personally reviewed and have noted: 1) the patient's medical and social history 2) The patient's current medications and supplements 3) The patient's height, weight, and BMI have been recorded in the chart   Vitamin D deficiency Last vitamin D Lab Results  Component Value Date   VD25OH 13.82 (L) 09/20/2022   Low to start oral replacement Vit D3 2000 u qd  PHN (postherpetic neuralgia) Resolved, no need further tx  Hypothyroidism Lab Results  Component Value Date   TSH 4.70 09/20/2022   Stable, pt to continue levothyroxine 25 mcg qd   Hyperlipidemia Lab Results  Component Value Date   LDLCALC 69 08/22/2021   Stable, pt to continue current statin crestor 20 mg qd, also for Cardiac CT score   B12 deficiency Lab Results  Component Value Date   VITAMINB12 486 09/20/2022   Stable, cont oral replacement - b12 1000 mcg qd  Tobacco abuse Pt  counsled to quit, pt not ready  Followup: No follow-ups on file.  Cathlean Cower, MD 09/24/2022 5:10 PM [facility]

## 2022-09-22 NOTE — Patient Instructions (Addendum)
Please take OTC Vitamin D3 at 2000 units per day, indefinitely  Please call if you would want the Bone Density testing ordered (to see if you need Prolia shots)  Please continue all other medications as before, and refills have been done as requested.  Please have the pharmacy call with any other refills you may need.  Please continue your efforts at being more active, low cholesterol diet, and weight control.  You are otherwise up to date with prevention measures today.  Please keep your appointments with your specialists as you may have planned  You will be contacted regarding the referral for: Cardiac CT score  Please make an Appointment to return for your 1 year visit, or sooner if needed, with Lab testing by Appointment as well, to be done about 3-5 days before at the [place] (so this is for TWO appointments - please see the scheduling desk as you leave)

## 2022-09-24 NOTE — Assessment & Plan Note (Signed)
Lab Results  Component Value Date   TSH 4.70 09/20/2022   Stable, pt to continue levothyroxine 25 mcg qd

## 2022-09-24 NOTE — Assessment & Plan Note (Signed)
Last vitamin D Lab Results  Component Value Date   VD25OH 13.82 (L) 09/20/2022   Low to start oral replacement Vit D3 2000 u qd

## 2022-09-24 NOTE — Assessment & Plan Note (Signed)
Pt counsled to quit, pt not ready °

## 2022-09-24 NOTE — Assessment & Plan Note (Signed)
Age and sex appropriate education and counseling updated with regular exercise and diet Referrals for preventative services - declines dxa Immunizations addressed - declines all Vax today Smoking counseling  - pt counsled to quit, pt not ready Evidence for depression or other mood disorder - none significant Most recent labs reviewed. I have personally reviewed and have noted: 1) the patient's medical and social history 2) The patient's current medications and supplements 3) The patient's height, weight, and BMI have been recorded in the chart

## 2022-09-24 NOTE — Assessment & Plan Note (Signed)
Resolved, no need further tx

## 2022-09-24 NOTE — Addendum Note (Signed)
Addended by: Biagio Borg on: 09/24/2022 05:12 PM   Modules accepted: Orders

## 2022-09-24 NOTE — Assessment & Plan Note (Addendum)
Lab Results  Component Value Date   LDLCALC 69 08/22/2021   Stable, pt to continue current statin crestor 20 mg qd, also for Cardiac CT score

## 2022-09-24 NOTE — Assessment & Plan Note (Signed)
Lab Results  Component Value Date   VITAMINB12 486 09/20/2022   Stable, cont oral replacement - b12 1000 mcg qd

## 2022-09-29 ENCOUNTER — Other Ambulatory Visit: Payer: Managed Care, Other (non HMO)

## 2022-12-26 ENCOUNTER — Telehealth: Payer: Managed Care, Other (non HMO) | Admitting: Nurse Practitioner

## 2022-12-26 DIAGNOSIS — J069 Acute upper respiratory infection, unspecified: Secondary | ICD-10-CM | POA: Diagnosis not present

## 2022-12-26 MED ORDER — FLUTICASONE PROPIONATE 50 MCG/ACT NA SUSP: 2.0000 | Freq: Every day | NASAL | 6 refills | Status: DC

## 2022-12-26 MED ORDER — BENZONATATE 100 MG PO CAPS: 100.0000 mg | ORAL_CAPSULE | Freq: Three times a day (TID) | ORAL | 0 refills | Status: DC | PRN

## 2022-12-26 NOTE — Progress Notes (Signed)
E-Visit for Upper Respiratory Infection   We are sorry you are not feeling well.  Here is how we plan to help!  Based on what you have shared with me, it looks like you may have a viral upper respiratory infection.  Upper respiratory infections are caused by a large number of viruses; however, rhinovirus is the most common cause.   Symptoms vary from person to person, with common symptoms including sore throat, cough, fatigue or lack of energy and feeling of general discomfort.  A low-grade fever of up to 100.4 may present, but is often uncommon.  Symptoms vary however, and are closely related to a person's age or underlying illnesses.  The most common symptoms associated with an upper respiratory infection are nasal discharge or congestion, cough, sneezing, headache and pressure in the ears and face.  These symptoms usually persist for about 3 to 10 days, but can last up to 2 weeks.  It is important to know that upper respiratory infections do not cause serious illness or complications in most cases.    Upper respiratory infections can be transmitted from person to person, with the most common method of transmission being a person's hands.  The virus is able to live on the skin and can infect other persons for up to 2 hours after direct contact.  Also, these can be transmitted when someone coughs or sneezes; thus, it is important to cover the mouth to reduce this risk.  To keep the spread of the illness at bay, good hand hygiene is very important.  This is an infection that is most likely caused by a virus. There are no specific treatments other than to help you with the symptoms until the infection runs its course.  We are sorry you are not feeling well.  Here is how we plan to help!   For nasal congestion, you may use an oral decongestants such as Mucinex D or if you have glaucoma or high blood pressure use plain Mucinex.  Saline nasal spray or nasal drops can help and can safely be used as often as  needed for congestion.  For your congestion, I have prescribed Fluticasone nasal spray one spray in each nostril twice a day  If you do not have a history of heart disease, hypertension, diabetes or thyroid disease, prostate/bladder issues or glaucoma, you may also use Sudafed to treat nasal congestion.  It is highly recommended that you consult with a pharmacist or your primary care physician to ensure this medication is safe for you to take.     If you have a cough, you may use cough suppressants such as Delsym and Robitussin.  If you have glaucoma or high blood pressure, you can also use Coricidin HBP.   For cough I have prescribed for you A prescription cough medication called Tessalon Perles 100 mg. You may take 1-2 capsules every 8 hours as needed for cough  If you have a sore or scratchy throat, use a saltwater gargle-  to  teaspoon of salt dissolved in a 4-ounce to 8-ounce glass of warm water.  Gargle the solution for approximately 15-30 seconds and then spit.  It is important not to swallow the solution.  You can also use throat lozenges/cough drops and Chloraseptic spray to help with throat pain or discomfort.  Warm or cold liquids can also be helpful in relieving throat pain.  For headache, pain or general discomfort, you can use Ibuprofen or Tylenol as directed.   Some authorities believe   that zinc sprays or the use of Echinacea may shorten the course of your symptoms.   HOME CARE Only take medications as instructed by your medical team. Be sure to drink plenty of fluids. Water is fine as well as fruit juices, sodas and electrolyte beverages. You may want to stay away from caffeine or alcohol. If you are nauseated, try taking small sips of liquids. How do you know if you are getting enough fluid? Your urine should be a pale yellow or almost colorless. Get rest. Taking a steamy shower or using a humidifier may help nasal congestion and ease sore throat pain. You can place a towel over  your head and breathe in the steam from hot water coming from a faucet. Using a saline nasal spray works much the same way. Cough drops, hard candies and sore throat lozenges may ease your cough. Avoid close contacts especially the very young and the elderly Cover your mouth if you cough or sneeze Always remember to wash your hands.   GET HELP RIGHT AWAY IF: You develop worsening fever. If your symptoms do not improve within 10 days You develop yellow or green discharge from your nose over 3 days. You have coughing fits You develop a severe head ache or visual changes. You develop shortness of breath, difficulty breathing or start having chest pain Your symptoms persist after you have completed your treatment plan  MAKE SURE YOU  Understand these instructions. Will watch your condition. Will get help right away if you are not doing well or get worse.  Thank you for choosing an e-visit.  Your e-visit answers were reviewed by a board certified advanced clinical practitioner to complete your personal care plan. Depending upon the condition, your plan could have included both over the counter or prescription medications.  Please review your pharmacy choice. Make sure the pharmacy is open so you can pick up prescription now. If there is a problem, you may contact your provider through MyChart messaging and have the prescription routed to another pharmacy.  Your safety is important to us. If you have drug allergies check your prescription carefully.   For the next 24 hours you can use MyChart to ask questions about today's visit, request a non-urgent call back, or ask for a work or school excuse. You will get an email in the next two days asking about your experience. I hope that your e-visit has been valuable and will speed your recovery.  Meds ordered this encounter  Medications   benzonatate (TESSALON) 100 MG capsule    Sig: Take 1 capsule (100 mg total) by mouth 3 (three) times daily  as needed.    Dispense:  30 capsule    Refill:  0   fluticasone (FLONASE) 50 MCG/ACT nasal spray    Sig: Place 2 sprays into both nostrils daily.    Dispense:  16 g    Refill:  6     I spent approximately 5 minutes reviewing the patient's history, current symptoms and coordinating their care today.   

## 2023-01-03 ENCOUNTER — Other Ambulatory Visit: Payer: Self-pay | Admitting: Internal Medicine

## 2023-01-03 DIAGNOSIS — Z1231 Encounter for screening mammogram for malignant neoplasm of breast: Secondary | ICD-10-CM

## 2023-01-31 ENCOUNTER — Ambulatory Visit (INDEPENDENT_AMBULATORY_CARE_PROVIDER_SITE_OTHER): Payer: Managed Care, Other (non HMO)

## 2023-01-31 DIAGNOSIS — Z1231 Encounter for screening mammogram for malignant neoplasm of breast: Secondary | ICD-10-CM

## 2023-10-15 ENCOUNTER — Other Ambulatory Visit (INDEPENDENT_AMBULATORY_CARE_PROVIDER_SITE_OTHER): Payer: Managed Care, Other (non HMO)

## 2023-10-15 DIAGNOSIS — E7800 Pure hypercholesterolemia, unspecified: Secondary | ICD-10-CM | POA: Diagnosis not present

## 2023-10-15 DIAGNOSIS — R739 Hyperglycemia, unspecified: Secondary | ICD-10-CM | POA: Diagnosis not present

## 2023-10-15 DIAGNOSIS — E559 Vitamin D deficiency, unspecified: Secondary | ICD-10-CM

## 2023-10-15 DIAGNOSIS — E538 Deficiency of other specified B group vitamins: Secondary | ICD-10-CM | POA: Diagnosis not present

## 2023-10-15 LAB — BASIC METABOLIC PANEL
BUN: 11 mg/dL (ref 6–23)
CO2: 28 meq/L (ref 19–32)
Calcium: 9 mg/dL (ref 8.4–10.5)
Chloride: 105 meq/L (ref 96–112)
Creatinine, Ser: 1.04 mg/dL (ref 0.40–1.20)
GFR: 55.35 mL/min — ABNORMAL LOW (ref >60.00)
Glucose, Bld: 82 mg/dL (ref 70–99)
Potassium: 3.9 meq/L (ref 3.5–5.1)
Sodium: 141 meq/L (ref 135–145)

## 2023-10-15 LAB — URINALYSIS, ROUTINE W REFLEX MICROSCOPIC
Bilirubin Urine: NEGATIVE
Ketones, ur: NEGATIVE
Nitrite: NEGATIVE
Specific Gravity, Urine: 1.02 (ref 1.000–1.030)
Total Protein, Urine: NEGATIVE
Urine Glucose: NEGATIVE
Urobilinogen, UA: 0.2 (ref 0.0–1.0)
pH: 6 (ref 5.0–8.0)

## 2023-10-15 LAB — HEPATIC FUNCTION PANEL
ALT: 18 U/L (ref 0–35)
AST: 19 U/L (ref 0–37)
Albumin: 4.1 g/dL (ref 3.5–5.2)
Alkaline Phosphatase: 76 U/L (ref 39–117)
Bilirubin, Direct: 0.2 mg/dL (ref 0.0–0.3)
Total Bilirubin: 1.1 mg/dL (ref 0.2–1.2)
Total Protein: 6.7 g/dL (ref 6.0–8.3)

## 2023-10-15 LAB — CBC WITH DIFFERENTIAL/PLATELET
Basophils Absolute: 0.1 10*3/uL (ref 0.0–0.1)
Basophils Relative: 1.2 % (ref 0.0–3.0)
Eosinophils Absolute: 0.7 10*3/uL (ref 0.0–0.7)
Eosinophils Relative: 6.2 % — ABNORMAL HIGH (ref 0.0–5.0)
HCT: 42.3 % (ref 36.0–46.0)
Hemoglobin: 14.4 g/dL (ref 12.0–15.0)
Lymphocytes Relative: 37.6 % (ref 12.0–46.0)
Lymphs Abs: 3.9 10*3/uL (ref 0.7–4.0)
MCHC: 34 g/dL (ref 30.0–36.0)
MCV: 90.5 fL (ref 78.0–100.0)
Monocytes Absolute: 1 10*3/uL (ref 0.1–1.0)
Monocytes Relative: 9.3 % (ref 3.0–12.0)
Neutro Abs: 4.8 10*3/uL (ref 1.4–7.7)
Neutrophils Relative %: 45.7 % (ref 43.0–77.0)
Platelets: 313 10*3/uL (ref 150.0–400.0)
RBC: 4.67 Mil/uL (ref 3.87–5.11)
RDW: 13.3 % (ref 11.5–15.5)
WBC: 10.5 10*3/uL (ref 4.0–10.5)

## 2023-10-15 LAB — LIPID PANEL
Cholesterol: 181 mg/dL (ref 0–200)
HDL: 70.9 mg/dL (ref >39.00)
LDL Cholesterol: 32 mg/dL (ref 0–99)
NonHDL: 110
Total CHOL/HDL Ratio: 3
Triglycerides: 389 mg/dL — ABNORMAL HIGH (ref 0.0–149.0)
VLDL: 77.8 mg/dL — ABNORMAL HIGH (ref 0.0–40.0)

## 2023-10-15 LAB — TSH: TSH: 3.54 u[IU]/mL (ref 0.35–5.50)

## 2023-10-15 LAB — VITAMIN B12: Vitamin B-12: 560 pg/mL (ref 211–911)

## 2023-10-15 LAB — HEMOGLOBIN A1C: Hgb A1c MFr Bld: 5.8 % (ref 4.6–6.5)

## 2023-10-15 LAB — VITAMIN D 25 HYDROXY (VIT D DEFICIENCY, FRACTURES): VITD: 21.51 ng/mL — ABNORMAL LOW (ref 30.00–100.00)

## 2023-10-18 ENCOUNTER — Ambulatory Visit: Payer: Managed Care, Other (non HMO) | Admitting: Internal Medicine

## 2023-10-18 VITALS — BP 134/80 | HR 82 | Temp 98.1°F | Ht 65.0 in | Wt 179.0 lb

## 2023-10-18 DIAGNOSIS — E039 Hypothyroidism, unspecified: Secondary | ICD-10-CM

## 2023-10-18 DIAGNOSIS — R739 Hyperglycemia, unspecified: Secondary | ICD-10-CM

## 2023-10-18 DIAGNOSIS — E538 Deficiency of other specified B group vitamins: Secondary | ICD-10-CM | POA: Diagnosis not present

## 2023-10-18 DIAGNOSIS — Z0001 Encounter for general adult medical examination with abnormal findings: Secondary | ICD-10-CM

## 2023-10-18 DIAGNOSIS — E7800 Pure hypercholesterolemia, unspecified: Secondary | ICD-10-CM

## 2023-10-18 DIAGNOSIS — N1831 Chronic kidney disease, stage 3a: Secondary | ICD-10-CM

## 2023-10-18 DIAGNOSIS — E559 Vitamin D deficiency, unspecified: Secondary | ICD-10-CM | POA: Diagnosis not present

## 2023-10-18 DIAGNOSIS — Z720 Tobacco use: Secondary | ICD-10-CM

## 2023-10-18 MED ORDER — ROSUVASTATIN CALCIUM 20 MG PO TABS: 20.0000 mg | ORAL_TABLET | Freq: Every day | ORAL | 3 refills | Status: DC

## 2023-10-18 MED ORDER — PANTOPRAZOLE SODIUM 40 MG PO TBEC: 40.0000 mg | DELAYED_RELEASE_TABLET | Freq: Every day | ORAL | 3 refills | Status: DC

## 2023-10-18 MED ORDER — LEVOTHYROXINE SODIUM 25 MCG PO TABS: 25.0000 ug | ORAL_TABLET | Freq: Every day | ORAL | 3 refills | Status: DC

## 2023-10-18 MED ORDER — ALBUTEROL SULFATE HFA 108 (90 BASE) MCG/ACT IN AERS: 2.0000 | INHALATION_SPRAY | Freq: Four times a day (QID) | RESPIRATORY_TRACT | 11 refills | Status: DC | PRN

## 2023-10-18 NOTE — Progress Notes (Signed)
Patient ID: Jasmine Palmer, female   DOB: 08/11/1956, 67 y.o.   MRN: [id number]         Chief Complaint:: wellness exam and lwo vit d, low thyroid, hld, low b12, hld       HPI:  Jasmine Palmer is a 67 y.o. female here for wellness exam; declines immunizations, dxa, o/w up to date                        Also has home excercises and intermittent fasting with only a few lbs increase. Pt denies chest pain, increased sob or doe, wheezing, orthopnea, PND, increased LE swelling, palpitations, dizziness or syncope.   Pt denies polydipsia, polyuria, or new focal neuro s/s.    Pt denies fever, wt loss, night sweats, loss of appetite, or other constitutional symptoms  Denies hyper or hypo thyroid symptoms such as voice, skin or hair change.     Wt Readings from Last 3 Encounters:  10/18/23 179 lb (81.2 kg)  09/22/22 177 lb (80.3 kg)  03/09/22 176 lb 12.8 oz (80.2 kg)   BP Readings from Last 3 Encounters:  10/18/23 134/80  09/22/22 128/76  03/09/22 122/68   Immunization History  Administered Date(s) Administered   Moderna Sars-Covid-2 Vaccination 01/04/2020, 01/26/2020, 11/05/2020   Rabies Immune Globulin 12/03/2019   Rabies, IM 12/03/2019, 12/06/2019, 12/13/2019   Td 12/05/1995   Health Maintenance Due  Topic Date Due   Pneumonia Vaccine 58+ Years old (1 of 2 - PCV) Never done   Zoster Vaccines- Shingrix (1 of 2) Never done   DTaP/Tdap/Td (2 - Tdap) 12/04/2005   DEXA SCAN  Never done   INFLUENZA VACCINE  Never done   COVID-19 Vaccine (4 - 2023-24 season) 08/05/2023      Past Medical History:  Diagnosis Date   ALLERGIC RHINITIS 10/09/2007   Anxiety state, unspecified 10/10/2007   DEPRESSION, MAJOR 09/20/2009   HYPERLIPIDEMIA 10/10/2007   HYPOTHYROIDISM 10/10/2007   Hypothyroidism 12/22/2011   INSOMNIA-SLEEP DISORDER-UNSPEC 09/27/2009   OBESITY, HX OF 10/09/2007   OSTEOPENIA 11/25/2010   History reviewed. No pertinent surgical history.  reports that she has been smoking cigarettes. She  has a 10 pack-year smoking history. She has never used smokeless tobacco. She reports that she does not drink alcohol and does not use drugs. family history includes Colon cancer in her mother; Heart disease in her father; Hypertension in her father; Lung cancer in her sister and sister. Allergies  Allergen Reactions   Ciprofloxacin Other (See Comments)    GI upset only   Fexofenadine    Pravastatin Sodium     REACTION: myalgia   Tramadol Other (See Comments)    Low blood pressrue   Current Outpatient Medications on File Prior to Visit  Medication Sig Dispense Refill   fluticasone (FLONASE) 50 MCG/ACT nasal spray Place 2 sprays into both nostrils daily. 16 g 6   No current facility-administered medications on file prior to visit.        ROS:  All others reviewed and negative.  Objective        PE:  BP 134/80 (BP Location: Left Arm, Patient Position: Sitting, Cuff Size: Normal)   Pulse 82   Temp 98.1 F (36.7 C) (Oral)   Ht 5\' 5"  (1.651 m)   Wt 179 lb (81.2 kg)   SpO2 97%   BMI 29.79 kg/m                 Constitutional: Pt appears  in NAD               HENT: Head: NCAT.                Right Ear: External ear normal.                 Left Ear: External ear normal.                Eyes: . Pupils are equal, round, and reactive to light. Conjunctivae and EOM are normal               Nose: without d/c or deformity               Neck: Neck supple. Gross normal ROM               Cardiovascular: Normal rate and regular rhythm.                 Pulmonary/Chest: Effort normal and breath sounds without rales or wheezing.                Abd:  Soft, NT, ND, + BS, no organomegaly               Neurological: Pt is alert. At baseline orientation, motor grossly intact               Skin: Skin is warm. No rashes, no other new lesions, LE edema - none               Psychiatric: Pt behavior is normal without agitation   Micro: none  Cardiac tracings I have personally interpreted today:   none  Pertinent Radiological findings (summarize): none   Lab Results  Component Value Date   WBC 10.5 10/15/2023   HGB 14.4 10/15/2023   HCT 42.3 10/15/2023   PLT 313.0 10/15/2023   GLUCOSE 82 10/15/2023   CHOL 181 10/15/2023   TRIG 389.0 (H) 10/15/2023   HDL 70.90 10/15/2023   LDLDIRECT 160.0 09/20/2022   LDLCALC 32 10/15/2023   ALT 18 10/15/2023   AST 19 10/15/2023   NA 141 10/15/2023   K 3.9 10/15/2023   CL 105 10/15/2023   CREATININE 1.04 10/15/2023   BUN 11 10/15/2023   CO2 28 10/15/2023   TSH 3.54 10/15/2023   HGBA1C 5.8 10/15/2023   MICROALBUR <0.7 09/20/2022   Assessment/Plan:  Jasmine Palmer is a 67 y.o. White or Caucasian [1] female with  has a past medical history of ALLERGIC RHINITIS (10/09/2007), Anxiety state, unspecified (10/10/2007), DEPRESSION, MAJOR (09/20/2009), HYPERLIPIDEMIA (10/10/2007), HYPOTHYROIDISM (10/10/2007), Hypothyroidism (12/22/2011), INSOMNIA-SLEEP DISORDER-UNSPEC (09/27/2009), OBESITY, HX OF (10/09/2007), and OSTEOPENIA (11/25/2010).  Encounter for well adult exam with abnormal findings Age and sex appropriate education and counseling updated with regular exercise and diet Referrals for preventative services - decliens dxa for now Immunizations addressed - declines all immunizations Smoking counseling  - counsled to quit, pt not ready Evidence for depression or other mood disorder - none significant Most recent labs reviewed. I have personally reviewed and have noted: 1) the patient's medical and social history 2) The patient's current medications and supplements 3) The patient's height, weight, and BMI have been recorded in the chart   Vitamin D deficiency Last vitamin D Lab Results  Component Value Date   VD25OH 21.51 (L) 10/15/2023   Ow, to start oral replacement   Hypothyroidism Lab Results  Component Value Date   TSH 3.54 10/15/2023   Stable, pt to continue levothyroxine 25  cg qd   Hyperlipidemia Lab Results  Component  Value Date   LDLCALC 32 10/15/2023   Stable, pt to continue current statin crestor 20 qd   B12 deficiency Lab Results  Component Value Date   VITAMINB12 560 10/15/2023   Stable, cont oral replacement - b12 1000 mcg qd   Tobacco abuse Pt counsled to quit, pt not ready  Stage 3a chronic kidney disease (HCC) Lab Results  Component Value Date   CREATININE 1.04 10/15/2023   Stable overall, cont to avoid nephrotoxins  Followup: Return in about 1 year (around 10/17/2024).  Oliver Barre, MD 10/20/2023 4:25 PM [facility]

## 2023-10-18 NOTE — Patient Instructions (Addendum)
Please call if you would want the Bone Density testing  Please continue all other medications as before, and refills have been done if requested.  Please have the pharmacy call with any other refills you may need.  Please continue your efforts at being more active, low cholesterol diet, and weight control.  You are otherwise up to date with prevention measures today.  Please keep your appointments with your specialists as you may have planned  Please make an Appointment to return for your 1 year visit, or sooner if needed, with Lab testing by Appointment as well, to be done about 3-5 days before at the [facility] (so this is for TWO appointments - please see the scheduling desk as you leave)

## 2023-10-20 ENCOUNTER — Encounter: Payer: Self-pay | Admitting: Internal Medicine

## 2023-10-20 NOTE — Assessment & Plan Note (Signed)
Lab Results  Component Value Date   TSH 3.54 10/15/2023   Stable, pt to continue levothyroxine 25 cg qd

## 2023-10-20 NOTE — Assessment & Plan Note (Signed)
Lab Results  Component Value Date   LDLCALC 32 10/15/2023   Stable, pt to continue current statin crestor 20 qd

## 2023-10-20 NOTE — Assessment & Plan Note (Addendum)
Age and sex appropriate education and counseling updated with regular exercise and diet Referrals for preventative services - decliens dxa for now Immunizations addressed - declines all immunizations Smoking counseling  - counsled to quit, pt not ready Evidence for depression or other mood disorder - none significant Most recent labs reviewed. I have personally reviewed and have noted: 1) the patient's medical and social history 2) The patient's current medications and supplements 3) The patient's height, weight, and BMI have been recorded in the chart

## 2023-10-20 NOTE — Assessment & Plan Note (Signed)
Last vitamin D Lab Results  Component Value Date   VD25OH 21.51 (L) 10/15/2023   Ow, to start oral replacement

## 2023-10-20 NOTE — Assessment & Plan Note (Signed)
Lab Results  Component Value Date   VITAMINB12 560 10/15/2023   Stable, cont oral replacement - b12 1000 mcg qd

## 2023-10-20 NOTE — Assessment & Plan Note (Signed)
Pt counsled to quit, pt not ready °

## 2023-10-20 NOTE — Assessment & Plan Note (Signed)
Lab Results  Component Value Date   CREATININE 1.04 10/15/2023   Stable overall, cont to avoid nephrotoxins

## 2024-01-14 ENCOUNTER — Encounter: Payer: Self-pay | Admitting: Internal Medicine

## 2024-01-14 ENCOUNTER — Ambulatory Visit (INDEPENDENT_AMBULATORY_CARE_PROVIDER_SITE_OTHER): Payer: Managed Care, Other (non HMO)

## 2024-01-14 ENCOUNTER — Ambulatory Visit: Payer: Managed Care, Other (non HMO) | Admitting: Internal Medicine

## 2024-01-14 VITALS — BP 140/100 | HR 65 | Temp 98.1°F | Ht 65.0 in | Wt 172.0 lb

## 2024-01-14 DIAGNOSIS — Z720 Tobacco use: Secondary | ICD-10-CM

## 2024-01-14 DIAGNOSIS — R053 Chronic cough: Secondary | ICD-10-CM

## 2024-01-14 DIAGNOSIS — N1831 Chronic kidney disease, stage 3a: Secondary | ICD-10-CM | POA: Diagnosis not present

## 2024-01-14 DIAGNOSIS — I10 Essential (primary) hypertension: Secondary | ICD-10-CM

## 2024-01-14 MED ORDER — AMLODIPINE BESYLATE 5 MG PO TABS: 5.0000 mg | ORAL_TABLET | Freq: Every day | ORAL | 0 refills | Status: DC

## 2024-01-14 NOTE — Patient Instructions (Signed)
We have sent in amlodipine to take 1 pill daily.

## 2024-01-14 NOTE — Progress Notes (Signed)
   Subjective:   Patient ID: Jasmine Palmer, female    DOB: [birth year], 68 y.o.   MRN: [id number]  HPI The patient is a 68 YO female coming in for high BP. High for last 4-5 days at home. Took 2 doses of cough/cold medicine otc last dose yesterday. Stopped when she realized it could impact high BP. Prior some rare readings high but not consistent. Diet not great but not different lately.   Review of Systems  Constitutional: Negative.   HENT: Negative.    Eyes: Negative.   Respiratory:  Negative for cough, chest tightness and shortness of breath.   Cardiovascular:  Negative for chest pain, palpitations and leg swelling.  Gastrointestinal:  Negative for abdominal distention, abdominal pain, constipation, diarrhea, nausea and vomiting.  Musculoskeletal: Negative.   Skin: Negative.   Neurological:  Positive for headaches.  Psychiatric/Behavioral: Negative.      Objective:  Physical Exam Constitutional:      Appearance: She is well-developed.  HENT:     Head: Normocephalic and atraumatic.  Cardiovascular:     Rate and Rhythm: Normal rate and regular rhythm.  Pulmonary:     Effort: Pulmonary effort is normal. No respiratory distress.     Breath sounds: Normal breath sounds. No wheezing or rales.  Abdominal:     General: Bowel sounds are normal. There is no distension.     Palpations: Abdomen is soft.     Tenderness: There is no abdominal tenderness. There is no rebound.  Musculoskeletal:     Cervical back: Normal range of motion.  Skin:    General: Skin is warm and dry.  Neurological:     Mental Status: She is alert and oriented to person, place, and time.     Coordination: Coordination normal.     Vitals:   01/14/24 1544  BP: (!) 140/100  Temp: 98.1 F (36.7 C)  TempSrc: Oral  Weight: 172 lb (78 kg)  Height: 5\' 5"  (1.651 m)   EKG: Rate 63, axis normal, interval normal, sinus, no st or t wave changes, no significant change compared to prior 2018  Assessment & Plan:

## 2024-01-15 DIAGNOSIS — I10 Essential (primary) hypertension: Secondary | ICD-10-CM | POA: Insufficient documentation

## 2024-01-15 NOTE — Assessment & Plan Note (Signed)
Cough for months and patient suspects GERD. She is a smoker which can cause chronic cough. She is taking protonix 40 mg daily and has added pepcid at bedtime. This is okay to continue. Checking CXR today as none prior. She does not qualify for lung cancer screening CT at this time.

## 2024-01-15 NOTE — Assessment & Plan Note (Signed)
We talked about the need for tight BP control to avoid change to renal function long term. Will need labs at follow up visit to ensure no changes.

## 2024-01-15 NOTE — Assessment & Plan Note (Signed)
Counseled to quit and advised this may be related to her chronic cough. She is unable to make quit attempt currently.

## 2024-01-15 NOTE — Assessment & Plan Note (Signed)
BP is elevated for multiple days and strong family history. EKG done without changes. Starting amlodipine 5 mg daily and follow up 2-4 weeks for adjustment with PCP.

## 2024-01-28 ENCOUNTER — Encounter: Payer: Self-pay | Admitting: Internal Medicine

## 2024-01-31 ENCOUNTER — Encounter: Payer: Self-pay | Admitting: Internal Medicine

## 2024-01-31 MED ORDER — LOSARTAN POTASSIUM 100 MG PO TABS: 100.0000 mg | ORAL_TABLET | Freq: Every day | ORAL | 3 refills | Status: DC

## 2024-01-31 NOTE — Telephone Encounter (Signed)
 Please to contact pt - needs ROV in 1-2 wks for HTN, but should be seen by any provider sooner if she wants for abd pain if she wants to do this instead      thanks

## 2024-01-31 NOTE — Telephone Encounter (Signed)
 Called and got Pt scheduled for OV.

## 2024-02-12 ENCOUNTER — Ambulatory Visit: Payer: Managed Care, Other (non HMO) | Admitting: Internal Medicine

## 2024-02-12 ENCOUNTER — Encounter: Payer: Self-pay | Admitting: Internal Medicine

## 2024-02-12 VITALS — BP 138/82 | HR 67 | Temp 98.9°F | Ht 65.0 in | Wt 172.0 lb

## 2024-02-12 DIAGNOSIS — N1831 Chronic kidney disease, stage 3a: Secondary | ICD-10-CM

## 2024-02-12 DIAGNOSIS — J309 Allergic rhinitis, unspecified: Secondary | ICD-10-CM

## 2024-02-12 DIAGNOSIS — E559 Vitamin D deficiency, unspecified: Secondary | ICD-10-CM | POA: Diagnosis not present

## 2024-02-12 DIAGNOSIS — E538 Deficiency of other specified B group vitamins: Secondary | ICD-10-CM

## 2024-02-12 DIAGNOSIS — E039 Hypothyroidism, unspecified: Secondary | ICD-10-CM

## 2024-02-12 DIAGNOSIS — Z720 Tobacco use: Secondary | ICD-10-CM | POA: Diagnosis not present

## 2024-02-12 DIAGNOSIS — I10 Essential (primary) hypertension: Secondary | ICD-10-CM

## 2024-02-12 DIAGNOSIS — E7800 Pure hypercholesterolemia, unspecified: Secondary | ICD-10-CM

## 2024-02-12 NOTE — Patient Instructions (Signed)
 Please call if you change your mind about having the Bone Density testing done  Please continue all other medications as before, and refills have been done if requested.  Please have the pharmacy call with any other refills you may need.  Please continue your efforts at being more active, low cholesterol diet, and weight control.  Please keep your appointments with your specialists as you may have planned  Please make an Appointment to return in 6 months, or sooner if needed, also with Lab Appointment for testing done 3-5 days before at the FIRST FLOOR Lab (so this is for TWO appointments - please see the scheduling desk as you leave)

## 2024-02-12 NOTE — Progress Notes (Signed)
 Patient ID: Jasmine Palmer, female   DOB: [birth year], 68 y.o.   MRN: [id number]        Chief Complaint: follow up low b12, hld, low thryoid, ckd3a, low vit d, smoker, htn, allergies       HPI:  Jasmine Palmer is a 68 y.o. female here overall doing ok.  Pt denies chest pain, increased sob or doe, wheezing, orthopnea, PND, increased LE swelling, palpitations, dizziness or syncope.   Pt denies polydipsia, polyuria, or new focal neuro s/s.    Pt denies fever, wt loss, night sweats, loss of appetite, or other constitutional symptoms  still smoking, not ready to quit  Denies hyper or hypo thyroid symptoms such as voice, skin or hair change.  Due for DXA but declines for now.  Does have several wks ongoing nasal allergy symptoms with clearish congestion, itch and sneezing, without fever, pain, ST, cough, swelling or wheezing.       Wt Readings from Last 3 Encounters:  02/12/24 172 lb (78 kg)  01/14/24 172 lb (78 kg)  10/18/23 179 lb (81.2 kg)   BP Readings from Last 3 Encounters:  02/12/24 138/82  01/14/24 (!) 138/94  10/18/23 134/80         Past Medical History:  Diagnosis Date   ALLERGIC RHINITIS 10/09/2007   Anxiety state, unspecified 10/10/2007   DEPRESSION, MAJOR 09/20/2009   HYPERLIPIDEMIA 10/10/2007   HYPOTHYROIDISM 10/10/2007   Hypothyroidism 12/22/2011   INSOMNIA-SLEEP DISORDER-UNSPEC 09/27/2009   OBESITY, HX OF 10/09/2007   OSTEOPENIA 11/25/2010   History reviewed. No pertinent surgical history.  reports that she has been smoking cigarettes. She has a 10 pack-year smoking history. She has never used smokeless tobacco. She reports that she does not drink alcohol and does not use drugs. family history includes Colon cancer in her mother; Heart disease in her father; Hypertension in her father; Lung cancer in her sister and sister. Allergies  Allergen Reactions   Ciprofloxacin Other (See Comments)    GI upset only   Fexofenadine    Pravastatin Sodium     REACTION: myalgia   Tramadol  Other (See Comments)    Low blood pressrue   Current Outpatient Medications on File Prior to Visit  Medication Sig Dispense Refill   albuterol (VENTOLIN HFA) 108 (90 Base) MCG/ACT inhaler Inhale 2 puffs into the lungs every 6 (six) hours as needed for wheezing or shortness of breath. 1 each 11   fluticasone (FLONASE) 50 MCG/ACT nasal spray Place 2 sprays into both nostrils daily. 16 g 6   levothyroxine (EUTHYROX) 25 MCG tablet Take 1 tablet (25 mcg total) by mouth daily before breakfast. 90 tablet 3   losartan (COZAAR) 100 MG tablet Take 1 tablet (100 mg total) by mouth daily. 90 tablet 3   pantoprazole (PROTONIX) 40 MG tablet Take 1 tablet (40 mg total) by mouth daily. 90 tablet 3   rosuvastatin (CRESTOR) 20 MG tablet Take 1 tablet (20 mg total) by mouth daily. 90 tablet 3   No current facility-administered medications on file prior to visit.        ROS:  All others reviewed and negative.  Objective        PE:  BP 138/82 (BP Location: Right Arm, Patient Position: Sitting, Cuff Size: Normal)   Pulse 67   Temp 98.9 F (37.2 C) (Oral)   Ht 5\' 5"  (1.651 m)   Wt 172 lb (78 kg)   SpO2 96%   BMI 28.62 kg/m  Constitutional: Pt appears in NAD               HENT: Head: NCAT.                Right Ear: External ear normal.                 Left Ear: External ear normal.                Eyes: . Pupils are equal, round, and reactive to light. Conjunctivae and EOM are normal               Nose: without d/c or deformity               Neck: Neck supple. Gross normal ROM               Cardiovascular: Normal rate and regular rhythm.                 Pulmonary/Chest: Effort normal and breath sounds without rales or wheezing.                Abd:  Soft, NT, ND, + BS, no organomegaly               Neurological: Pt is alert. At baseline orientation, motor grossly intact               Skin: Skin is warm. No rashes, no other new lesions, LE edema - none               Psychiatric: Pt  behavior is normal without agitation   Micro: none  Cardiac tracings I have personally interpreted today:  none  Pertinent Radiological findings (summarize): none   Lab Results  Component Value Date   WBC 10.5 10/15/2023   HGB 14.4 10/15/2023   HCT 42.3 10/15/2023   PLT 313.0 10/15/2023   GLUCOSE 82 10/15/2023   CHOL 181 10/15/2023   TRIG 389.0 (H) 10/15/2023   HDL 70.90 10/15/2023   LDLDIRECT 160.0 09/20/2022   LDLCALC 32 10/15/2023   ALT 18 10/15/2023   AST 19 10/15/2023   NA 141 10/15/2023   K 3.9 10/15/2023   CL 105 10/15/2023   CREATININE 1.04 10/15/2023   BUN 11 10/15/2023   CO2 28 10/15/2023   TSH 3.54 10/15/2023   HGBA1C 5.8 10/15/2023   MICROALBUR <0.7 09/20/2022   Assessment/Plan:  Jasmine Palmer is a 68 y.o. White or Caucasian [1] female with  has a past medical history of ALLERGIC RHINITIS (10/09/2007), Anxiety state, unspecified (10/10/2007), DEPRESSION, MAJOR (09/20/2009), HYPERLIPIDEMIA (10/10/2007), HYPOTHYROIDISM (10/10/2007), Hypothyroidism (12/22/2011), INSOMNIA-SLEEP DISORDER-UNSPEC (09/27/2009), OBESITY, HX OF (10/09/2007), and OSTEOPENIA (11/25/2010).  B12 deficiency Lab Results  Component Value Date   VITAMINB12 560 10/15/2023   Stable, cont oral replacement - b12 1000 mcg qd   Hyperlipidemia Lab Results  Component Value Date   LDLCALC 32 10/15/2023   Stable, pt to continue current statin crestor 20 qd   Hypothyroidism Lab Results  Component Value Date   TSH 3.54 10/15/2023   Stable, pt to continue levothyroxine 25 mcg qd  Primary hypertension BP Readings from Last 3 Encounters:  02/12/24 138/82  01/14/24 (!) 138/94  10/18/23 134/80   Stable, pt to continue medical treatment losartan 100 mg qd   Stage 3a chronic kidney disease (HCC) Lab Results  Component Value Date   CREATININE 1.04 10/15/2023   Stable overall, cont to avoid nephrotoxins   Vitamin D deficiency Last vitamin D Lab  Results  Component Value Date   VD25OH  21.51 (L) 10/15/2023   Low, to start oral replacement   Tobacco abuse Pt counsled to quit, pt not ready  Allergic rhinitis Mild to mod, for retart flonase asd,  to f/u any worsening symptoms or concerns  Followup: Return in about 6 months (around 08/14/2024).  Oliver Barre, MD 02/16/2024 4:11 PM [facility] [place] [facility]

## 2024-02-16 ENCOUNTER — Encounter: Payer: Self-pay | Admitting: Internal Medicine

## 2024-02-16 NOTE — Assessment & Plan Note (Signed)
 Lab Results  Component Value Date   LDLCALC 32 10/15/2023   Stable, pt to continue current statin crestor 20 qd

## 2024-02-16 NOTE — Assessment & Plan Note (Signed)
 Lab Results  Component Value Date   VITAMINB12 560 10/15/2023   Stable, cont oral replacement - b12 1000 mcg qd

## 2024-02-16 NOTE — Assessment & Plan Note (Signed)
 Last vitamin D Lab Results  Component Value Date   VD25OH 21.51 (L) 10/15/2023   Low, to start oral replacement

## 2024-02-16 NOTE — Assessment & Plan Note (Signed)
 BP Readings from Last 3 Encounters:  02/12/24 138/82  01/14/24 (!) 138/94  10/18/23 134/80   Stable, pt to continue medical treatment losartan 100 mg qd

## 2024-02-16 NOTE — Assessment & Plan Note (Signed)
 Lab Results  Component Value Date   CREATININE 1.04 10/15/2023   Stable overall, cont to avoid nephrotoxins

## 2024-02-16 NOTE — Assessment & Plan Note (Signed)
 Mild to mod, for retart flonase asd,  to f/u any worsening symptoms or concerns

## 2024-02-16 NOTE — Assessment & Plan Note (Signed)
 Pt counsled to quit, pt not ready

## 2024-02-16 NOTE — Assessment & Plan Note (Signed)
 Lab Results  Component Value Date   TSH 3.54 10/15/2023   Stable, pt to continue levothyroxine 25 mcg qd

## 2024-06-12 ENCOUNTER — Ambulatory Visit: Admitting: Internal Medicine

## 2024-06-12 ENCOUNTER — Encounter: Payer: Self-pay | Admitting: Internal Medicine

## 2024-06-12 VITALS — BP 148/96 | HR 78 | Temp 98.0°F | Ht 65.0 in | Wt 167.4 lb

## 2024-06-12 DIAGNOSIS — J069 Acute upper respiratory infection, unspecified: Secondary | ICD-10-CM

## 2024-06-12 DIAGNOSIS — J309 Allergic rhinitis, unspecified: Secondary | ICD-10-CM

## 2024-06-12 DIAGNOSIS — I10 Essential (primary) hypertension: Secondary | ICD-10-CM

## 2024-06-12 DIAGNOSIS — R42 Dizziness and giddiness: Secondary | ICD-10-CM

## 2024-06-12 DIAGNOSIS — E559 Vitamin D deficiency, unspecified: Secondary | ICD-10-CM | POA: Diagnosis not present

## 2024-06-12 MED ORDER — CETIRIZINE HCL 10 MG PO TABS: 10.0000 mg | ORAL_TABLET | Freq: Every day | ORAL | 11 refills | Status: DC

## 2024-06-12 MED ORDER — MECLIZINE HCL 12.5 MG PO TABS: 12.5000 mg | ORAL_TABLET | Freq: Three times a day (TID) | ORAL | 1 refills | Status: DC | PRN

## 2024-06-12 MED ORDER — METOPROLOL SUCCINATE ER 50 MG PO TB24: 50.0000 mg | ORAL_TABLET | Freq: Every day | ORAL | 3 refills | Status: DC

## 2024-06-12 MED ORDER — FLUTICASONE PROPIONATE 50 MCG/ACT NA SUSP: 2.0000 | Freq: Every day | NASAL | 6 refills | Status: DC

## 2024-06-12 NOTE — Progress Notes (Signed)
 Patient ID: Jasmine Palmer, female   DOB: 1956-07-18, 68 y.o.   MRN: [id number]        Chief Complaint: follow up HTN, allergic rhinitis, vertigo, low vit d       HPI:  Jasmine Palmer is a 68 y.o. female here overall doing ok, has hx of intolerance to amlodipine  due to abd pain, and more recently losartan  that she is convinced caused diarrhea.  Does have several wks ongoing nasal allergy symptoms with clearish congestion, itch and sneezing, without fever, pain, ST, cough, swelling or wheezing, but has had also mild intermittent vertigo with head movement in past week.        Wt Readings from Last 3 Encounters:  06/12/24 167 lb 6.4 oz (75.9 kg)  02/12/24 172 lb (78 kg)  01/14/24 172 lb (78 kg)   BP Readings from Last 3 Encounters:  06/12/24 (!) 148/96  02/12/24 138/82  01/14/24 (!) 138/94         Past Medical History:  Diagnosis Date   ALLERGIC RHINITIS 10/09/2007   Anxiety state, unspecified 10/10/2007   DEPRESSION, MAJOR 09/20/2009   HYPERLIPIDEMIA 10/10/2007   HYPOTHYROIDISM 10/10/2007   Hypothyroidism 12/22/2011   INSOMNIA-SLEEP DISORDER-UNSPEC 09/27/2009   OBESITY, HX OF 10/09/2007   OSTEOPENIA 11/25/2010   History reviewed. No pertinent surgical history.  reports that she has been smoking cigarettes. She has a 10 pack-year smoking history. She has never used smokeless tobacco. She reports that she does not drink alcohol and does not use drugs. family history includes Colon cancer in her mother; Heart disease in her father; Hypertension in her father; Lung cancer in her sister and sister. Allergies  Allergen Reactions   Ciprofloxacin  Other (See Comments)    GI upset only   Fexofenadine    Pravastatin Sodium     REACTION: myalgia   Tramadol  Other (See Comments)    Low blood pressrue   Current Outpatient Medications on File Prior to Visit  Medication Sig Dispense Refill   albuterol  (VENTOLIN  HFA) 108 (90 Base) MCG/ACT inhaler Inhale 2 puffs into the lungs every 6 (six) hours as  needed for wheezing or shortness of breath. 1 each 11   levothyroxine  (EUTHYROX ) 25 MCG tablet Take 1 tablet (25 mcg total) by mouth daily before breakfast. 90 tablet 3   pantoprazole  (PROTONIX ) 40 MG tablet Take 1 tablet (40 mg total) by mouth daily. 90 tablet 3   rosuvastatin  (CRESTOR ) 20 MG tablet Take 1 tablet (20 mg total) by mouth daily. 90 tablet 3   losartan  (COZAAR ) 100 MG tablet Take 1 tablet (100 mg total) by mouth daily. (Patient not taking: Reported on 06/12/2024) 90 tablet 3   No current facility-administered medications on file prior to visit.        ROS:  All others reviewed and negative.  Objective        PE:  BP (!) 148/96   Pulse 78   Temp 98 F (36.7 C)   Ht 5' 5 (1.651 m)   Wt 167 lb 6.4 oz (75.9 kg)   SpO2 95%   BMI 27.86 kg/m                 Constitutional: Pt appears in NAD               HENT: Head: NCAT.                Right Ear: External ear normal.  Left Ear: External ear normal. Bilat tm's with mild erythema.  Max sinus areas non tender.  Pharynx with mild erythema, no exudate               Eyes: . Pupils are equal, round, and reactive to light. Conjunctivae and EOM are normal               Nose: without d/c or deformity               Neck: Neck supple. Gross normal ROM               Cardiovascular: Normal rate and regular rhythm.                 Pulmonary/Chest: Effort normal and breath sounds without rales or wheezing.                Abd:  Soft, NT, ND, + BS, no organomegaly               Neurological: Pt is alert. At baseline orientation, motor grossly intact               Skin: Skin is warm. No rashes, no other new lesions, LE edema - none               Psychiatric: Pt behavior is normal without agitation   Micro: none  Cardiac tracings I have personally interpreted today:  none  Pertinent Radiological findings (summarize): none   Lab Results  Component Value Date   WBC 10.5 10/15/2023   HGB 14.4 10/15/2023   HCT 42.3  10/15/2023   PLT 313.0 10/15/2023   GLUCOSE 82 10/15/2023   CHOL 181 10/15/2023   TRIG 389.0 (H) 10/15/2023   HDL 70.90 10/15/2023   LDLDIRECT 160.0 09/20/2022   LDLCALC 32 10/15/2023   ALT 18 10/15/2023   AST 19 10/15/2023   NA 141 10/15/2023   K 3.9 10/15/2023   CL 105 10/15/2023   CREATININE 1.04 10/15/2023   BUN 11 10/15/2023   CO2 28 10/15/2023   TSH 3.54 10/15/2023   HGBA1C 5.8 10/15/2023   Assessment/Plan:  Jasmine Palmer is a 68 y.o. White or Caucasian [1] female with  has a past medical history of ALLERGIC RHINITIS (10/09/2007), Anxiety state, unspecified (10/10/2007), DEPRESSION, MAJOR (09/20/2009), HYPERLIPIDEMIA (10/10/2007), HYPOTHYROIDISM (10/10/2007), Hypothyroidism (12/22/2011), INSOMNIA-SLEEP DISORDER-UNSPEC (09/27/2009), OBESITY, HX OF (10/09/2007), and OSTEOPENIA (11/25/2010).  Vitamin D  deficiency Last vitamin D  Lab Results  Component Value Date   VD25OH 21.51 (L) 10/15/2023   Low, to start oral replacement   Primary hypertension BP Readings from Last 3 Encounters:  06/12/24 (!) 148/96  02/12/24 138/82  01/14/24 (!) 138/94   uncontrolled, pt has perceived intolerance to amlodipine  and losartan  and will not take further, now to start toprol  xl 50 mg qd   Allergic rhinitis Mild to mod, for zyrtec  10 mg every day prn, and restart flonase  asd,  to f/u any worsening symptoms or concerns  Vertigo Mild intermittent peripheral  - for meclizine  prn, also refer to ENT per pt reqeust  Followup: Return if symptoms worsen or fail to improve.  Lynwood Rush, MD 06/13/2024 6:30 PM [facility] [place] [facility] - [facility] Internal Medicine

## 2024-06-12 NOTE — Patient Instructions (Signed)
 Please take all new medication as prescribed - the toprol  xl 50 mg per day for BP, the meclizine  12.5 mg as needed for vertigo, and zyrtec  10 mg instead of the claritin  Please continue all other medications as before, and refills have been done for the Flonase   Please have the pharmacy call with any other refills you may need.  Please continue your efforts at being more active, low cholesterol diet, and weight control.  Please keep your appointments with your specialists as you may have planned  You will be contacted regarding the referral for: ENT - Dr Karis

## 2024-06-13 ENCOUNTER — Encounter: Payer: Self-pay | Admitting: Internal Medicine

## 2024-06-13 DIAGNOSIS — R42 Dizziness and giddiness: Secondary | ICD-10-CM | POA: Insufficient documentation

## 2024-06-13 NOTE — Assessment & Plan Note (Signed)
 BP Readings from Last 3 Encounters:  06/12/24 (!) 148/96  02/12/24 138/82  01/14/24 (!) 138/94   uncontrolled, pt has perceived intolerance to amlodipine  and losartan  and will not take further, now to start toprol  xl 50 mg qd

## 2024-06-13 NOTE — Assessment & Plan Note (Signed)
 Mild to mod, for zyrtec  10 mg every day prn, and restart flonase  asd,  to f/u any worsening symptoms or concerns

## 2024-06-13 NOTE — Assessment & Plan Note (Signed)
 Last vitamin D Lab Results  Component Value Date   VD25OH 21.51 (L) 10/15/2023   Low, to start oral replacement

## 2024-06-13 NOTE — Assessment & Plan Note (Addendum)
 Mild intermittent peripheral  - for meclizine  prn, also refer to ENT per pt reqeust

## 2024-09-02 ENCOUNTER — Encounter: Payer: Self-pay | Admitting: Internal Medicine

## 2024-09-03 MED ORDER — OMEPRAZOLE 40 MG PO CPDR: 40.0000 mg | DELAYED_RELEASE_CAPSULE | Freq: Every day | ORAL | 3 refills | Status: AC

## 2024-11-17 ENCOUNTER — Other Ambulatory Visit: Payer: Self-pay | Admitting: Internal Medicine

## 2024-11-17 ENCOUNTER — Other Ambulatory Visit: Payer: Self-pay

## 2024-11-20 ENCOUNTER — Encounter: Payer: Self-pay | Admitting: Internal Medicine

## 2024-11-20 DIAGNOSIS — E559 Vitamin D deficiency, unspecified: Secondary | ICD-10-CM

## 2024-11-20 DIAGNOSIS — E538 Deficiency of other specified B group vitamins: Secondary | ICD-10-CM

## 2024-11-20 DIAGNOSIS — R739 Hyperglycemia, unspecified: Secondary | ICD-10-CM

## 2024-11-20 DIAGNOSIS — E7800 Pure hypercholesterolemia, unspecified: Secondary | ICD-10-CM

## 2024-11-21 ENCOUNTER — Other Ambulatory Visit (INDEPENDENT_AMBULATORY_CARE_PROVIDER_SITE_OTHER)

## 2024-11-21 DIAGNOSIS — E538 Deficiency of other specified B group vitamins: Secondary | ICD-10-CM | POA: Diagnosis not present

## 2024-11-21 DIAGNOSIS — R739 Hyperglycemia, unspecified: Secondary | ICD-10-CM | POA: Diagnosis not present

## 2024-11-21 DIAGNOSIS — E7800 Pure hypercholesterolemia, unspecified: Secondary | ICD-10-CM | POA: Diagnosis not present

## 2024-11-21 DIAGNOSIS — E559 Vitamin D deficiency, unspecified: Secondary | ICD-10-CM | POA: Diagnosis not present

## 2024-11-21 LAB — LIPID PANEL
Cholesterol: 296 mg/dL — ABNORMAL HIGH (ref 28–200)
HDL: 63.5 mg/dL
LDL Cholesterol: 175 mg/dL — ABNORMAL HIGH (ref 10–99)
NonHDL: 232.43
Total CHOL/HDL Ratio: 5
Triglycerides: 285 mg/dL — ABNORMAL HIGH (ref 10.0–149.0)
VLDL: 57 mg/dL — ABNORMAL HIGH (ref 0.0–40.0)

## 2024-11-21 LAB — URINALYSIS, ROUTINE W REFLEX MICROSCOPIC
Bilirubin Urine: NEGATIVE
Ketones, ur: NEGATIVE
Nitrite: NEGATIVE
Specific Gravity, Urine: 1.015 (ref 1.000–1.030)
Total Protein, Urine: NEGATIVE
Urine Glucose: NEGATIVE
Urobilinogen, UA: 0.2 (ref 0.0–1.0)
pH: 6.5 (ref 5.0–8.0)

## 2024-11-21 LAB — HEPATIC FUNCTION PANEL
ALT: 12 U/L (ref 3–35)
AST: 14 U/L (ref 5–37)
Albumin: 4.2 g/dL (ref 3.5–5.2)
Alkaline Phosphatase: 78 U/L (ref 39–117)
Bilirubin, Direct: 0.2 mg/dL (ref 0.1–0.3)
Total Bilirubin: 1.3 mg/dL — ABNORMAL HIGH (ref 0.2–1.2)
Total Protein: 6.7 g/dL (ref 6.0–8.3)

## 2024-11-21 LAB — CBC WITH DIFFERENTIAL/PLATELET
Basophils Absolute: 0.1 K/uL (ref 0.0–0.1)
Basophils Relative: 0.9 % (ref 0.0–3.0)
Eosinophils Absolute: 1.2 K/uL — ABNORMAL HIGH (ref 0.0–0.7)
Eosinophils Relative: 9.4 % — ABNORMAL HIGH (ref 0.0–5.0)
HCT: 45.3 % (ref 36.0–46.0)
Hemoglobin: 15.6 g/dL — ABNORMAL HIGH (ref 12.0–15.0)
Lymphocytes Relative: 28.3 % (ref 12.0–46.0)
Lymphs Abs: 3.7 K/uL (ref 0.7–4.0)
MCHC: 34.3 g/dL (ref 30.0–36.0)
MCV: 90.1 fl (ref 78.0–100.0)
Monocytes Absolute: 1.1 K/uL — ABNORMAL HIGH (ref 0.1–1.0)
Monocytes Relative: 8.2 % (ref 3.0–12.0)
Neutro Abs: 7 K/uL (ref 1.4–7.7)
Neutrophils Relative %: 53.2 % (ref 43.0–77.0)
Platelets: 317 K/uL (ref 150.0–400.0)
RBC: 5.03 Mil/uL (ref 3.87–5.11)
RDW: 13.6 % (ref 11.5–15.5)
WBC: 13.1 K/uL — ABNORMAL HIGH (ref 4.0–10.5)

## 2024-11-21 LAB — BASIC METABOLIC PANEL WITH GFR
BUN: 7 mg/dL (ref 6–23)
CO2: 28 meq/L (ref 19–32)
Calcium: 9.2 mg/dL (ref 8.4–10.5)
Chloride: 102 meq/L (ref 96–112)
Creatinine, Ser: 0.95 mg/dL (ref 0.40–1.20)
GFR: 61.22 mL/min
Glucose, Bld: 92 mg/dL (ref 70–99)
Potassium: 3.7 meq/L (ref 3.5–5.1)
Sodium: 140 meq/L (ref 135–145)

## 2024-11-21 LAB — HEMOGLOBIN A1C: Hgb A1c MFr Bld: 5.6 % (ref 4.6–6.5)

## 2024-11-21 LAB — TSH: TSH: 3.89 u[IU]/mL (ref 0.35–5.50)

## 2024-11-21 LAB — VITAMIN B12: Vitamin B-12: 272 pg/mL (ref 211–911)

## 2024-11-21 LAB — VITAMIN D 25 HYDROXY (VIT D DEFICIENCY, FRACTURES): VITD: 18.36 ng/mL — ABNORMAL LOW (ref 30.00–100.00)

## 2024-11-24 ENCOUNTER — Ambulatory Visit: Admitting: Internal Medicine

## 2024-11-24 VITALS — BP 142/80 | HR 86 | Temp 98.2°F | Ht 65.0 in | Wt 162.0 lb

## 2024-11-24 DIAGNOSIS — Z0001 Encounter for general adult medical examination with abnormal findings: Secondary | ICD-10-CM

## 2024-11-24 DIAGNOSIS — R300 Dysuria: Secondary | ICD-10-CM | POA: Diagnosis not present

## 2024-11-24 DIAGNOSIS — E7800 Pure hypercholesterolemia, unspecified: Secondary | ICD-10-CM | POA: Diagnosis not present

## 2024-11-24 DIAGNOSIS — I10 Essential (primary) hypertension: Secondary | ICD-10-CM | POA: Diagnosis not present

## 2024-11-24 DIAGNOSIS — Z720 Tobacco use: Secondary | ICD-10-CM | POA: Diagnosis not present

## 2024-11-24 DIAGNOSIS — K219 Gastro-esophageal reflux disease without esophagitis: Secondary | ICD-10-CM | POA: Diagnosis not present

## 2024-11-24 DIAGNOSIS — E559 Vitamin D deficiency, unspecified: Secondary | ICD-10-CM

## 2024-11-24 DIAGNOSIS — N1831 Chronic kidney disease, stage 3a: Secondary | ICD-10-CM

## 2024-11-24 DIAGNOSIS — Z0000 Encounter for general adult medical examination without abnormal findings: Secondary | ICD-10-CM | POA: Diagnosis not present

## 2024-11-24 DIAGNOSIS — J069 Acute upper respiratory infection, unspecified: Secondary | ICD-10-CM

## 2024-11-24 MED ORDER — ALBUTEROL SULFATE HFA 108 (90 BASE) MCG/ACT IN AERS: 2.0000 | INHALATION_SPRAY | Freq: Four times a day (QID) | RESPIRATORY_TRACT | 11 refills | Status: AC | PRN

## 2024-11-24 MED ORDER — CETIRIZINE HCL 10 MG PO TABS: 10.0000 mg | ORAL_TABLET | Freq: Every day | ORAL | 11 refills | Status: AC

## 2024-11-24 MED ORDER — MECLIZINE HCL 12.5 MG PO TABS: 12.5000 mg | ORAL_TABLET | Freq: Three times a day (TID) | ORAL | 1 refills | Status: AC | PRN

## 2024-11-24 MED ORDER — CEPHALEXIN 500 MG PO CAPS: 500.0000 mg | ORAL_CAPSULE | Freq: Three times a day (TID) | ORAL | 1 refills | Status: DC

## 2024-11-24 MED ORDER — ROSUVASTATIN CALCIUM 20 MG PO TABS: 20.0000 mg | ORAL_TABLET | Freq: Every day | ORAL | 3 refills | Status: AC

## 2024-11-24 MED ORDER — FLUTICASONE PROPIONATE 50 MCG/ACT NA SUSP: 2.0000 | Freq: Every day | NASAL | 6 refills | Status: AC

## 2024-11-24 MED ORDER — FAMOTIDINE 40 MG PO TABS: 40.0000 mg | ORAL_TABLET | Freq: Every day | ORAL | 3 refills | Status: AC

## 2024-11-24 MED ORDER — METOPROLOL SUCCINATE ER 50 MG PO TB24: 50.0000 mg | ORAL_TABLET | Freq: Every day | ORAL | 3 refills | Status: AC

## 2024-11-24 MED ORDER — LEVOTHYROXINE SODIUM 25 MCG PO TABS: 25.0000 ug | ORAL_TABLET | Freq: Every day | ORAL | 3 refills | Status: AC

## 2024-11-24 NOTE — Patient Instructions (Signed)
 Ok to stop the protonix  as you have  Please take all new medication as prescribed - the pepcid  40  mg per day, and the antibiotic  Please take OTC Vitamin D3 at 2000 units per day, indefinitely  Please continue all other medications as before, and refills have been done including the cholesterol medication  Please have the pharmacy call with any other refills you may need.  Please continue your efforts at being more active, low cholesterol diet, and weight control.  You are otherwise up to date with prevention measures today.  Please keep your appointments with your specialists as you may have planned  You will be contacted regarding the referral for: GI  Please make an Appointment to return in 6 months, or sooner if needed

## 2024-11-24 NOTE — Progress Notes (Signed)
 "       Chief Complaint:: wellness exam and possible uti, htn, low vit d, hld, gerd with HH and PPI intolerant       HPI:  Jasmine Palmer is a 68 y.o. female here for wellness exam; due for Dxa, declines all vaccinations, o/w up to date                        Also BP has been controlled ok at home per pt; has recently run out of her statin.  Has persistent reflux and has been PPI intolerant.  Pt denies chest pain, increased sob or doe, wheezing, orthopnea, PND, increased LE swelling, palpitations, dizziness or syncope.   Pt denies polydipsia, polyuria, or new focal neuro s/s.    Pt denies fever, wt loss, night sweats, loss of appetite, or other constitutional symptoms  Does have recurring dysuria and frequency, but Denies urinary symptoms such as urgency, flank pain, hematuria or n/v, fever, chills.   Wt Readings from Last 3 Encounters:  11/24/24 162 lb (73.5 kg)  06/12/24 167 lb 6.4 oz (75.9 kg)  02/12/24 172 lb (78 kg)   BP Readings from Last 3 Encounters:  11/24/24 (!) 142/80  06/12/24 (!) 148/96  02/12/24 138/82   Immunization History  Administered Date(s) Administered   Moderna Sars-Covid-2 Vaccination 01/04/2020, 01/26/2020, 11/05/2020   Rabies Immune Globulin 12/03/2019   Rabies, IM 12/03/2019, 12/06/2019, 12/13/2019   Td 12/05/1995   Health Maintenance Due  Topic Date Due   Pneumococcal Vaccine: 50+ Years (1 of 2 - PCV) Never done   Zoster Vaccines- Shingrix (1 of 2) Never done   DTaP/Tdap/Td (2 - Tdap) 12/04/2005   Bone Density Scan  Never done      Past Medical History:  Diagnosis Date   ALLERGIC RHINITIS 10/09/2007   Anxiety state, unspecified 10/10/2007   DEPRESSION, MAJOR 09/20/2009   HYPERLIPIDEMIA 10/10/2007   HYPOTHYROIDISM 10/10/2007   Hypothyroidism 12/22/2011   INSOMNIA-SLEEP DISORDER-UNSPEC 09/27/2009   OBESITY, HX OF 10/09/2007   OSTEOPENIA 11/25/2010   History reviewed. No pertinent surgical history.  reports that she has been smoking cigarettes. She has a  10 pack-year smoking history. She has never used smokeless tobacco. She reports that she does not drink alcohol and does not use drugs. family history includes Colon cancer in her mother; Heart disease in her father; Hypertension in her father; Lung cancer in her sister and sister. Allergies[1] Medications Ordered Prior to Encounter[2]      ROS:  All others reviewed and negative.  Objective        PE:  BP (!) 142/80 (BP Location: Left Arm, Patient Position: Sitting, Cuff Size: Normal)   Pulse 86   Temp 98.2 F (36.8 C) (Oral)   Ht 5' 5 (1.651 m)   Wt 162 lb (73.5 kg)   SpO2 96%   BMI 26.96 kg/m                 Constitutional: Pt appears in NAD               HENT: Head: NCAT.                Right Ear: External ear normal.                 Left Ear: External ear normal.                Eyes: . Pupils are equal, round, and reactive to light.  Conjunctivae and EOM are normal               Nose: without d/c or deformity               Neck: Neck supple. Gross normal ROM               Cardiovascular: Normal rate and regular rhythm.                 Pulmonary/Chest: Effort normal and breath sounds without rales or wheezing.                Abd:  Soft, NT, ND, + BS, no organomegaly               Neurological: Pt is alert. At baseline orientation, motor grossly intact               Skin: Skin is warm. No rashes, no other new lesions, LE edema - none               Psychiatric: Pt behavior is normal without agitation   Micro: none  Cardiac tracings I have personally interpreted today:  none  Pertinent Radiological findings (summarize): none   Lab Results  Component Value Date   WBC 13.1 (H) 11/21/2024   HGB 15.6 (H) 11/21/2024   HCT 45.3 11/21/2024   PLT 317.0 11/21/2024   GLUCOSE 92 11/21/2024   CHOL 296 (H) 11/21/2024   TRIG 285.0 (H) 11/21/2024   HDL 63.50 11/21/2024   LDLDIRECT 160.0 09/20/2022   LDLCALC 175 (H) 11/21/2024   ALT 12 11/21/2024   AST 14 11/21/2024   NA 140  11/21/2024   K 3.7 11/21/2024   CL 102 11/21/2024   CREATININE 0.95 11/21/2024   BUN 7 11/21/2024   CO2 28 11/21/2024   TSH 3.89 11/21/2024   HGBA1C 5.6 11/21/2024   Assessment/Plan:  Jasmine Palmer is a 68 y.o. White or Caucasian [1] female with  has a past medical history of ALLERGIC RHINITIS (10/09/2007), Anxiety state, unspecified (10/10/2007), DEPRESSION, MAJOR (09/20/2009), HYPERLIPIDEMIA (10/10/2007), HYPOTHYROIDISM (10/10/2007), Hypothyroidism (12/22/2011), INSOMNIA-SLEEP DISORDER-UNSPEC (09/27/2009), OBESITY, HX OF (10/09/2007), and OSTEOPENIA (11/25/2010).  Encounter for well adult exam with abnormal findings Age and sex appropriate education and counseling updated with regular exercise and diet Referrals for preventative services - for DXA Immunizations addressed - declines all Smoking counseling  - pt counsled to quit, pt not ready Evidence for depression or other mood disorder - none significant Most recent labs reviewed. I have personally reviewed and have noted: 1) the patient's medical and social history 2) The patient's current medications and supplements 3) The patient's height, weight, and BMI have been recorded in the chart   Vitamin D  deficiency Last vitamin D  Lab Results  Component Value Date   VD25OH 18.36 (L) 11/21/2024   Low, to start oral replacement  Tobacco abuse Pt counsled to quit, pt not ready  Stage 3a chronic kidney disease (HCC) Lab Results  Component Value Date   CREATININE 0.95 11/21/2024   Stable overall, cont to avoid nephrotoxins  Primary hypertension BP Readings from Last 3 Encounters:  11/24/24 (!) 142/80  06/12/24 (!) 148/96  02/12/24 138/82   Uncontrolled, but pt states controlled at home, pt to continue medical treatment toprol  xl 50 mg qd   Gastroesophageal reflux disease Uncontrolled, ok for pepcid  40 mg every day, refer gI  Hyperlipidemia Lab Results  Component Value Date   LDLCALC 175 (H) 11/21/2024   Uncontrolled,  pt to  restart current statin crestor  20 mg qd   Dysuria Mild to mod, ok for urine culture, also cephalexin  500 tid Followup: Return in about 6 months (around 05/25/2025).  Lynwood Rush, MD 11/26/2024 10:47 PM [facility]     [1]  Allergies Allergen Reactions   Ciprofloxacin  Other (See Comments)    GI upset only   Fexofenadine    Pravastatin Sodium     REACTION: myalgia   Tramadol  Other (See Comments)    Low blood pressrue  [2]  Current Outpatient Medications on File Prior to Visit  Medication Sig Dispense Refill   omeprazole  (PRILOSEC) 40 MG capsule Take 1 capsule (40 mg total) by mouth daily. 90 capsule 3   pantoprazole  (PROTONIX ) 40 MG tablet Take 1 tablet by mouth once daily 90 tablet 0   No current facility-administered medications on file prior to visit.   "

## 2024-11-25 ENCOUNTER — Encounter: Payer: Self-pay | Admitting: Internal Medicine

## 2024-11-25 DIAGNOSIS — E2839 Other primary ovarian failure: Secondary | ICD-10-CM

## 2024-11-26 ENCOUNTER — Encounter: Payer: Self-pay | Admitting: Internal Medicine

## 2024-11-26 DIAGNOSIS — R300 Dysuria: Secondary | ICD-10-CM | POA: Insufficient documentation

## 2024-11-26 MED ORDER — DOXYCYCLINE HYCLATE 100 MG PO TABS: 100.0000 mg | ORAL_TABLET | Freq: Two times a day (BID) | ORAL | 0 refills | Status: AC

## 2024-11-26 NOTE — Assessment & Plan Note (Signed)
 BP Readings from Last 3 Encounters:  11/24/24 (!) 142/80  06/12/24 (!) 148/96  02/12/24 138/82   Uncontrolled, but pt states controlled at home, pt to continue medical treatment toprol  xl 50 mg qd

## 2024-11-26 NOTE — Assessment & Plan Note (Signed)
 Lab Results  Component Value Date   CREATININE 0.95 11/21/2024   Stable overall, cont to avoid nephrotoxins

## 2024-11-26 NOTE — Assessment & Plan Note (Signed)
 Mild to mod, ok for urine culture, also cephalexin  500 tid

## 2024-11-26 NOTE — Assessment & Plan Note (Addendum)
 Uncontrolled, ok for pepcid  40 mg every day, refer gI

## 2024-11-26 NOTE — Assessment & Plan Note (Signed)
 Lab Results  Component Value Date   LDLCALC 175 (H) 11/21/2024   Uncontrolled, pt to restart current statin crestor  20 mg qd

## 2024-11-26 NOTE — Assessment & Plan Note (Signed)
 Last vitamin D  Lab Results  Component Value Date   VD25OH 18.36 (L) 11/21/2024   Low, to start oral replacement

## 2024-11-26 NOTE — Assessment & Plan Note (Signed)
 Pt counsled to quit, pt not ready

## 2024-11-26 NOTE — Assessment & Plan Note (Addendum)
 Age and sex appropriate education and counseling updated with regular exercise and diet Referrals for preventative services - for DXA Immunizations addressed - declines all Smoking counseling  - pt counsled to quit, pt not ready Evidence for depression or other mood disorder - none significant Most recent labs reviewed. I have personally reviewed and have noted: 1) the patient's medical and social history 2) The patient's current medications and supplements 3) The patient's height, weight, and BMI have been recorded in the chart

## 2024-11-28 MED ORDER — VITAMIN D (ERGOCALCIFEROL) 1.25 MG (50000 UNIT) PO CAPS: 50000.0000 [IU] | ORAL_CAPSULE | ORAL | 0 refills | Status: AC

## 2024-11-28 NOTE — Addendum Note (Signed)
 Addended by: NORLEEN LYNWOOD ORN on: 11/28/2024 04:49 PM   Modules accepted: Orders
